# Patient Record
Sex: Male | Born: 1950 | Race: White | Hispanic: No | Marital: Single | Smoking: Former smoker
Health system: Southern US, Community
[De-identification: ages and names within clinical notes are randomized; demographics above are authoritative.]

## PROBLEM LIST (undated history)

## (undated) DIAGNOSIS — J45909 Unspecified asthma, uncomplicated: Secondary | ICD-10-CM

## (undated) DIAGNOSIS — I10 Essential (primary) hypertension: Secondary | ICD-10-CM

## (undated) DIAGNOSIS — F32A Depression, unspecified: Secondary | ICD-10-CM

## (undated) DIAGNOSIS — D649 Anemia, unspecified: Secondary | ICD-10-CM

## (undated) DIAGNOSIS — I739 Peripheral vascular disease, unspecified: Secondary | ICD-10-CM

## (undated) DIAGNOSIS — E119 Type 2 diabetes mellitus without complications: Secondary | ICD-10-CM

## (undated) DIAGNOSIS — F329 Major depressive disorder, single episode, unspecified: Secondary | ICD-10-CM

## (undated) DIAGNOSIS — E785 Hyperlipidemia, unspecified: Secondary | ICD-10-CM

## (undated) DIAGNOSIS — G8929 Other chronic pain: Secondary | ICD-10-CM

## (undated) DIAGNOSIS — G4730 Sleep apnea, unspecified: Secondary | ICD-10-CM

## (undated) DIAGNOSIS — K219 Gastro-esophageal reflux disease without esophagitis: Secondary | ICD-10-CM

## (undated) DIAGNOSIS — N189 Chronic kidney disease, unspecified: Secondary | ICD-10-CM

## (undated) HISTORY — DX: Type 2 diabetes mellitus without complications: E11.9

## (undated) HISTORY — DX: Hyperlipidemia, unspecified: E78.5

## (undated) HISTORY — DX: Anemia, unspecified: D64.9

## (undated) HISTORY — DX: Other chronic pain: G89.29

## (undated) HISTORY — DX: Essential (primary) hypertension: I10

## (undated) HISTORY — DX: Depression, unspecified: F32.A

---
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
## 2008-04-10 ENCOUNTER — Encounter: Admission: RE | Admit: 2008-04-10 | Discharge: 2008-04-10 | Payer: Self-pay | Admitting: *Deleted

## 2008-05-10 ENCOUNTER — Ambulatory Visit (HOSPITAL_COMMUNITY): Admission: RE | Admit: 2008-05-10 | Discharge: 2008-05-10 | Payer: Self-pay | Admitting: *Deleted

## 2008-05-17 ENCOUNTER — Ambulatory Visit (HOSPITAL_COMMUNITY): Admission: RE | Admit: 2008-05-17 | Discharge: 2008-05-17 | Payer: Self-pay | Admitting: *Deleted

## 2008-10-06 ENCOUNTER — Encounter: Admission: RE | Admit: 2008-10-06 | Discharge: 2009-01-04 | Payer: Self-pay | Admitting: *Deleted

## 2008-10-16 ENCOUNTER — Inpatient Hospital Stay (HOSPITAL_COMMUNITY): Admission: RE | Admit: 2008-10-16 | Discharge: 2008-10-19 | Payer: Self-pay | Admitting: *Deleted

## 2008-10-17 ENCOUNTER — Encounter (INDEPENDENT_AMBULATORY_CARE_PROVIDER_SITE_OTHER): Payer: Self-pay | Admitting: *Deleted

## 2008-10-17 ENCOUNTER — Ambulatory Visit: Payer: Self-pay | Admitting: Surgery

## 2008-10-26 ENCOUNTER — Emergency Department (HOSPITAL_COMMUNITY): Admission: EM | Admit: 2008-10-26 | Discharge: 2008-10-26 | Payer: Self-pay | Admitting: Emergency Medicine

## 2009-12-06 IMAGING — RF DG UGI W/ GASTROGRAFIN
8 series · 15 of 15 positions shown · non-contrast
Comparison: none

CLINICAL DATA: Morbid obesity.  Gastric bypass.

WATER SOLUBLE UPPER GI SERIES
TECHNIQUE: Single-column upper GI series was performed using 50 ml
Gastrografin.
Fluoroscopy Time: 3.9 minutes.

[Series 1: run · 3 of 3 slices shown (1 of 6)]
[im 1/3]
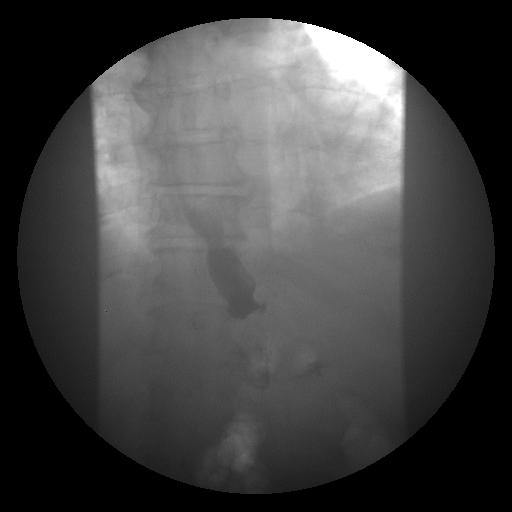
[im 2/3]
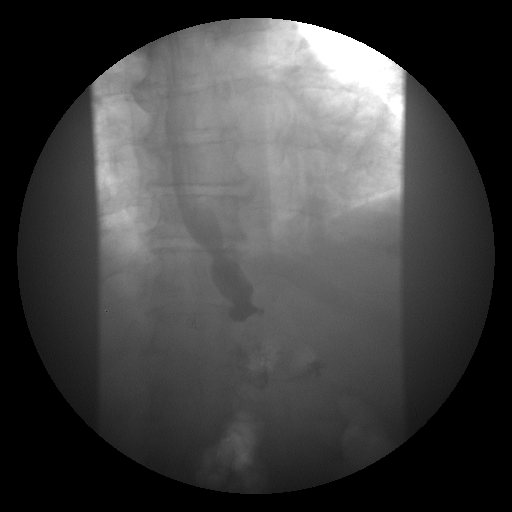
[im 3/3]
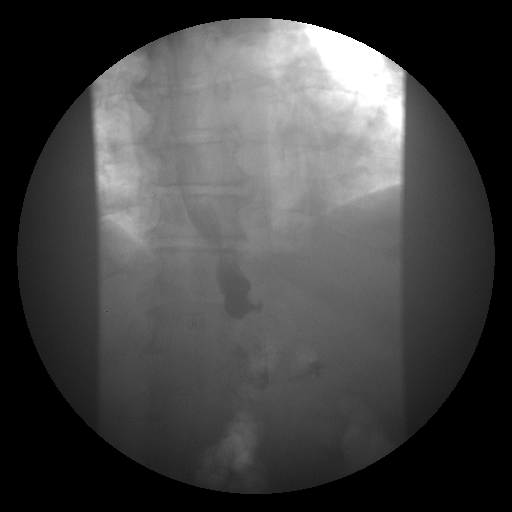

[Series 2: run · 2 of 2 slices shown (2 of 6)]
[im 1/2]
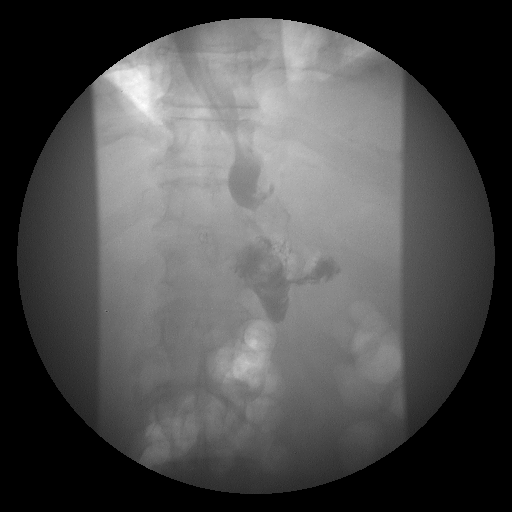
[im 2/2]
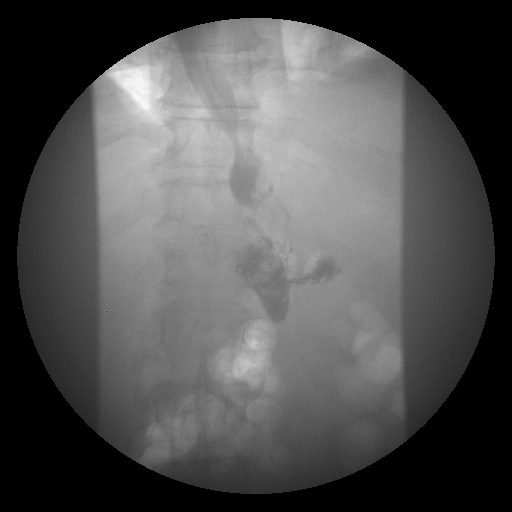

[Series 3: run · 2 of 2 slices shown (3 of 6)]
[im 1/2]
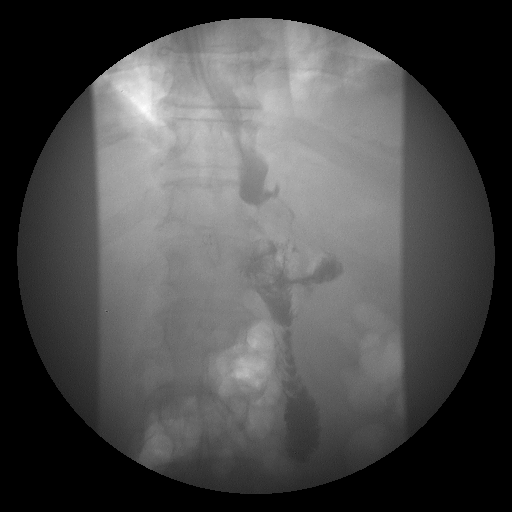
[im 2/2]
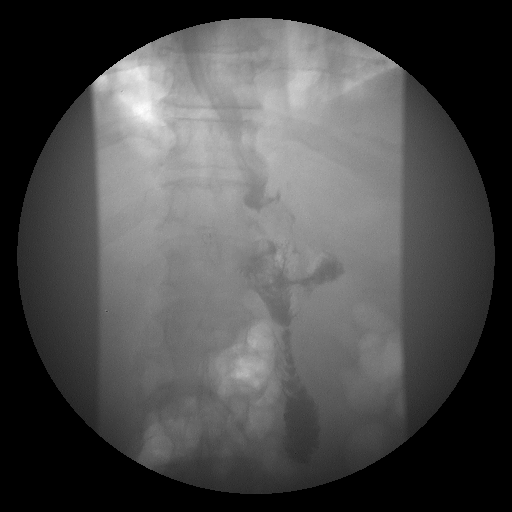

[Series 4: run · 1 of 1 slices shown (4 of 6)]
[im 1/1]
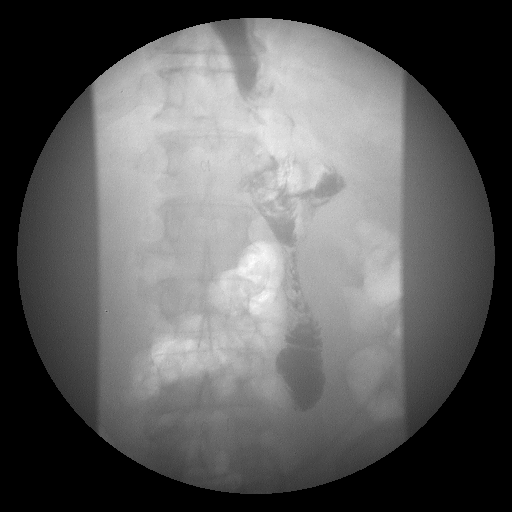

[Series 5: run · 3 of 3 slices shown (5 of 6)]
[im 1/3]
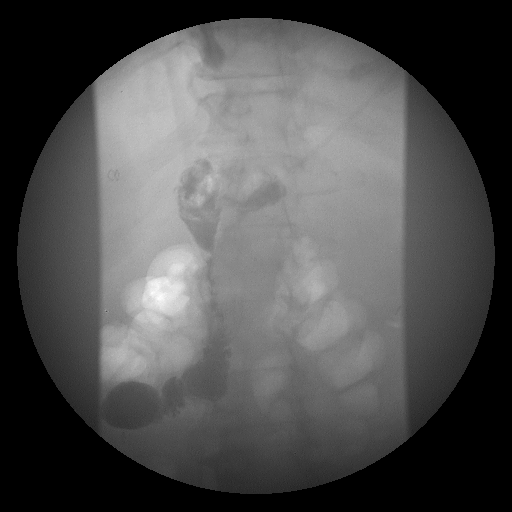
[im 2/3]
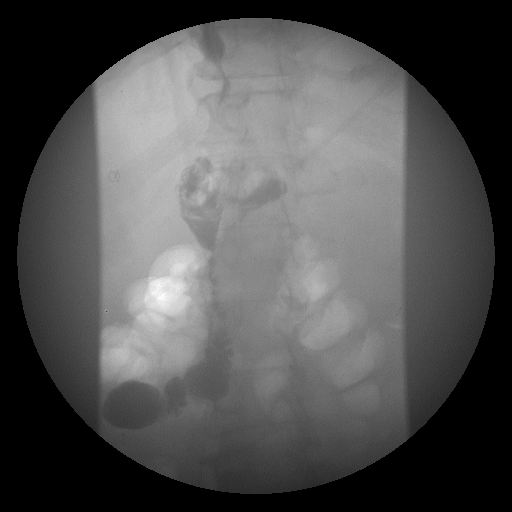
[im 3/3]
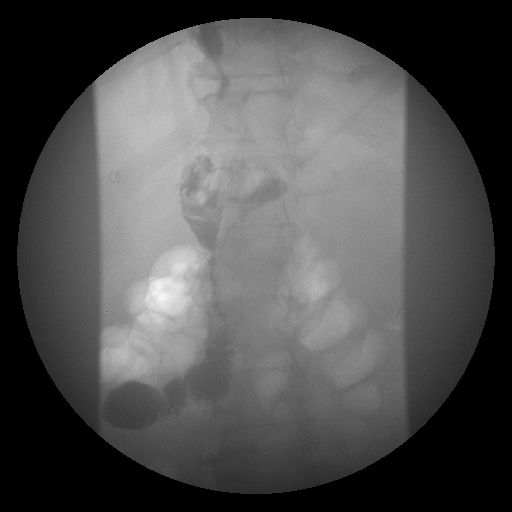

[Series 6: run · 2 of 2 slices shown (6 of 6)]
[im 1/2]
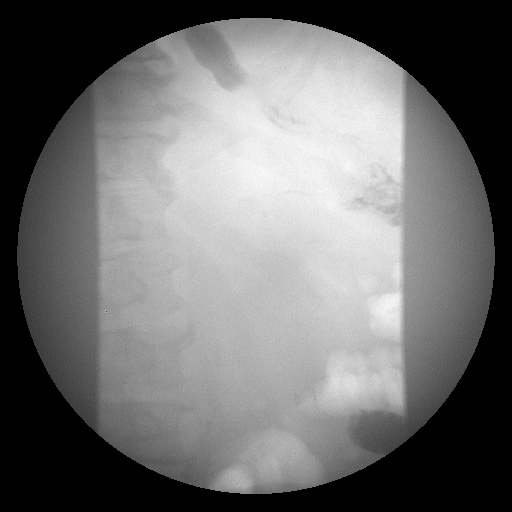
[im 2/2]
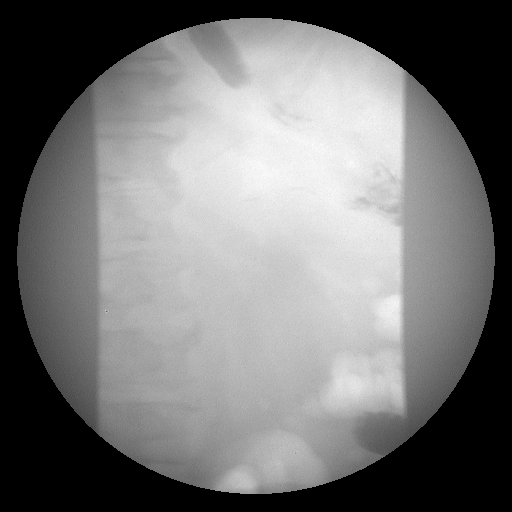

[Series 1001: view not recorded · 0.20mm/px · 1 of 1 slices shown (1 of 2)]
[im 1/1]
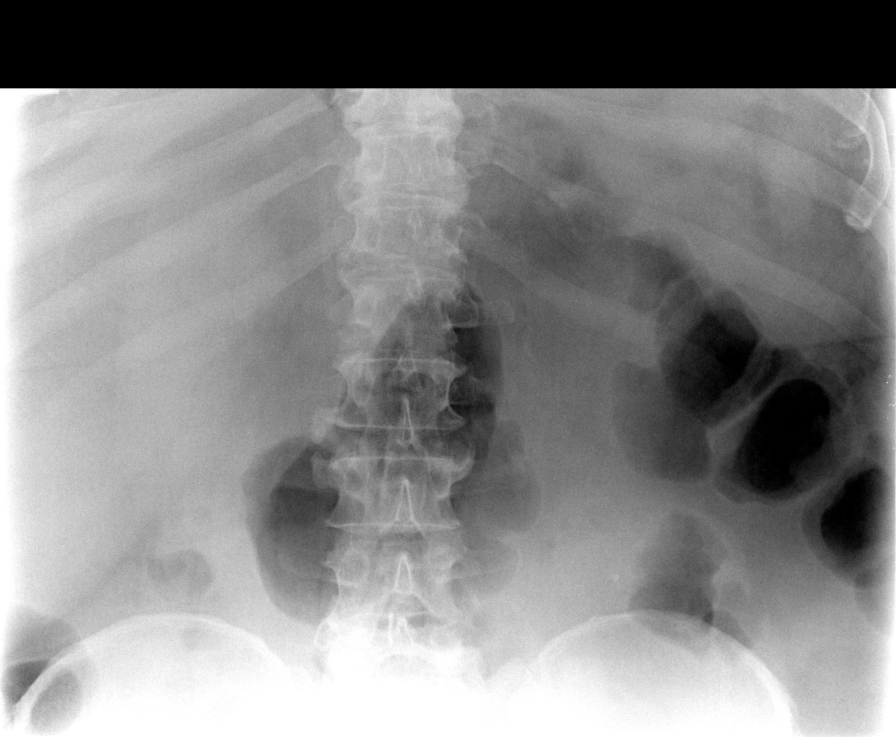

[Series 1002: view not recorded · 0.20mm/px · 1 of 1 slices shown (2 of 2)]
[im 1/1]
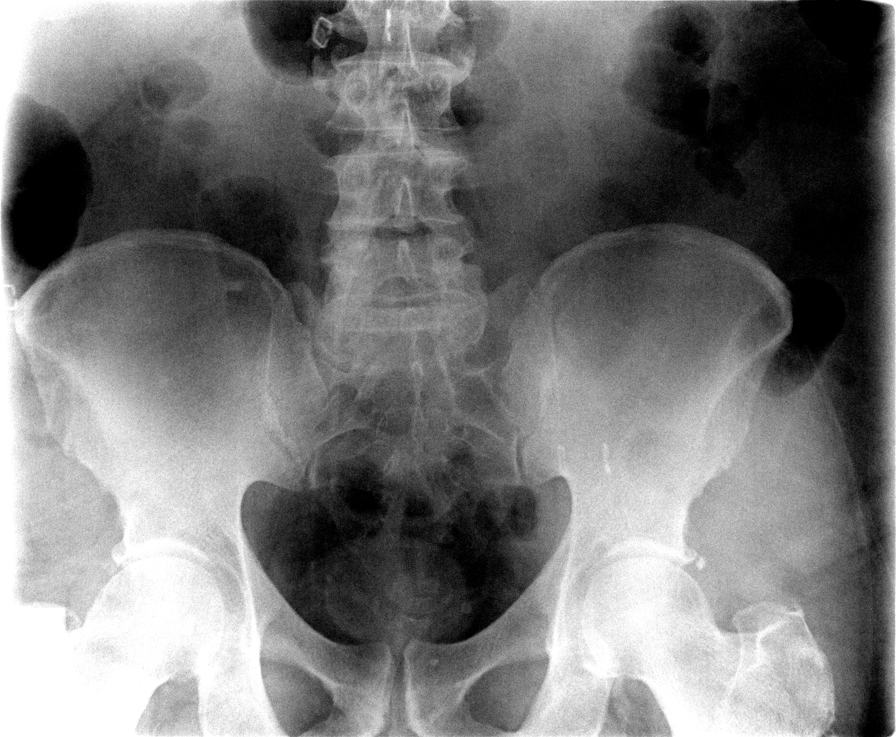

[15 of 15 positions shown; findings below may reference images not displayed]

FINDINGS: The patient has had an antecolic and antegastric bypass.
Roux-en-Y anatomy is intact.  No leakage or obstruction.
Unremarkable appearance of the gastrojejunal and jejunojejunal
anastomoses.
IMPRESSION: Satisfactory appearance of the postoperative Roux-en-Y anatomy

## 2011-03-18 NOTE — Discharge Summary (Signed)
[name], [name]                  ACCOUNT NO.:  [id number]   MEDICAL RECORD NO.:  [id number]          PATIENT TYPE:  INP   LOCATION:  1525                         FACILITY:  [facility]   PHYSICIAN:  Alfonse Ras, MD   DATE OF BIRTH:  1951-09-07   DATE OF ADMISSION:  10/16/2008  DATE OF DISCHARGE:  10/19/2008                               DISCHARGE SUMMARY   ADMISSION DIAGNOSES:  1. Medically refractory morbid obesity.  2. Insulin-dependent diabetes mellitus.  3. Hypertension from obstructive sleep apnea.   BRIEF HPI:  The patient is a very pleasant 60 year old male who has been  worked up extensively, preoperatively, for laparoscopic Roux-en-Y  gastric bypass.  He underwent home bowel prep and was admitted.   HOSPITAL COURSE:  The patient was admitted and had laparoscopic Roux-en-  Y gastric bypass ante-colic-antegastric with closure of Peterson's  defect.  Postoperatively, he did well, and underwent upper GI study  which showed no evidence of leak.  On postoperative day #1, he was  advanced to a liquid diet, and then by postoperative day  #2 was doing  very well.  Postoperative day #2, diet with protein shakes.  However,  his hemoglobin directed down to 8.9.  He was kept in the hospital that  night.  His heart rate was in the 90s.  His hemoglobin was checked  following morning, it was 8.4.  It was checked later that day on  postoperative day #3, and was up to 8.9, and he was ready for discharge  home.   DISPOSITION:  Discharge to home.  Followup is with me in 2 weeks.  Medications include all medications in the med rec form, and we would  like to start elixir for pain.      Alfonse Ras, MD  Electronically Signed     KRE/MEDQ  D:  11/16/2008  T:  11/17/2008  Job:  [id number]

## 2011-03-18 NOTE — H&P (Signed)
[name], [name]                  ACCOUNT NO.:  [id number]   MEDICAL RECORD NO.:  [id number]          PATIENT TYPE:  EMS   LOCATION:  MAJO                         FACILITY:  [facility]   PHYSICIAN:  Sandria Bales. Ezzard Standing, M.D.  DATE OF BIRTH:  1951/09/26   DATE OF ADMISSION:  10/26/2008  DATE OF DISCHARGE:  10/26/2008                              HISTORY & PHYSICAL   Date of surgery ??   CHIEF COMPLAINT:  Nausea.   HISTORY OF ILLNESS:  [name] is a 60 year old white male who is  morbidly obese and is a patient of Dr. Dineen Kid in [place].  He underwent  an uneventful laparoscopic Roux-en-Y gastric bypass by Dr. Baruch Merl  on October 16, 2008, and was discharged on October 19, 2008.  He  weighed initially as much as 390 pounds.  He has lost down to 353 pounds  in anticipation of his bypass surgery.   He is taking a post op RYGB diet which includes protein drinks and  liquids only.  He has not been advanced to regular foods though he says  he is hungry now.   He apparently had some nausea, called last night and a lab was obtained  where his white blood count was noted to be 17,000.  (I do not have this  lab in front of me.)  He spoke with Dr. Johna Sheriff and because of the  question of nausea and of a white blood count, it is thought that he be  best seen in the emergency room.   So, Dr. Johna Sheriff sent him to [facility] Emergency Room where I could evaluate  him.   He has had some trouble eating though he has some Benadryl that he took  for some sinus drainage yesterday and this has helped his nausea some  also.  He has had some loose stools since his surgery, but actually he  seems to feel pretty good.   He is most concerned about a low abdominal wall ecchymotic area which I  think is just from the surgery, but there is no evidence of any  infection, cellulitis, or acute changes.   PAST MEDICAL HISTORY:  He has an allergy to MORPHINE.   MEDICATIONS:  He takes the pain medicines.  He is  on,  1. Diovan for his blood pressure.  2. Actos for diabetes.  3. Xanax.  4. He was on Glucotrol and insulin, but he stopped both because his      blood sugars got so low.   Also, he suffers from hypertension.   He has insulin-dependent diabetes mellitus which has improved since his  surgery.   Then, he has depression and anxiety for which he takes the Xanax.   He also has sleep apnea.  He was supposed to be use the CPAP machine,  but he says he cannot tolerate to be on his nose or face.   PHYSICAL EXAMINATION:  VITAL SIGNS:  His temperature was 97, blood  pressure 192/52, pulse is 91, and respirations 24.  GENERAL:  He is a well-nourished obese white male, alert, and  cooperative with  the physical exam.  HEENT:  Unremarkable.  CARDIAC:  His lungs are clear to auscultation.  HEART:  Regular rate and rhythm without murmur or rub.  ABDOMEN:  He has active bowel sounds.  He has a lot of ecchymoses in his  lower abdominal wall.  It looks as it emanates from the left paramedian  trocar, but again his ecchymoses appears all.  There is no acute  inflammation or cellulitis.  I think this will keep getting better with  time.  EXTREMITIES:  He has good strength in all 4 extremities.   LABORATORY DATA:  White blood count 12,000 with 74% neutrophils.  He has  hemoglobin of  9.4, hematocrit 28.6, and platelet count 425,000.  His  sodium 133, potassium 3.3, chloride of 97, BUN of 18, creatinine of  0.85, and his lipase was 20.   IMPRESSION:  1. Status post right gastric bypass.  I think he is doing well except      I think for his abnormal hematoma, which I think is going to      resolve without any further intervention.  I tried to reassure him      that he could use the Benadryl for nausea or just stay on his      liquids like he doing right now.  He is set to see Dr. Colin Benton on      November 02, 2008.  He should keep that appointment.  2. Postoperative/blood loss anemia which is  stable.  3. Nausea which is mild and I encouraged him that the nausea will get      better on its own.  4. Hypertension for which he is going to keep an eye on his blood      pressure medicine.  5. Diabetes mellitus.  He has already stopped his insulin.  6. Xanax for depression.      Sandria Bales. Ezzard Standing, M.D.  Electronically Signed     DHN/MEDQ  D:  10/26/2008  T:  10/27/2008  Job:  [id number]   cc:   Alfonse Ras, MD  Dr. Dineen Kid

## 2011-03-18 NOTE — Op Note (Signed)
NAMEABOUBACAR, MATSUO                  ACCOUNT NO.:  [id number]   MEDICAL RECORD NO.:  [id number]          PATIENT TYPE:  INP   LOCATION:  1525                         FACILITY:  [facility]   PHYSICIAN:  Sharlet Salina T. Hoxworth, M.D.DATE OF BIRTH:  1950-12-19   DATE OF PROCEDURE:  10/16/2008  DATE OF DISCHARGE:                               OPERATIVE REPORT   PROCEDURE:  Upper GI endoscopy.   DESCRIPTION OF PROCEDURE:  Upper endoscopy is performed at the  completion of laparoscopic Roux-en-Y gastric bypass by Dr. Colin Benton.  The  Olympus video endoscope was inserted in the upper esophagus and passed  under direct vision to the EG junction at approximately 45 cm.  Esophagus appeared normal.  The small gastric pouch was entered.  The  pouch was then tensely distended with air while Dr. Colin Benton clamped the  outlet and there was no evidence of leak.  Suture and staple lines  appeared intact.  The anastomosis was visualized and was patent.  The  pouch measured 5 cm in length.  The pouch was then desufflated.  The  scope withdrawn.      Lorne Skeens. Hoxworth, M.D.  Electronically Signed     BTH/MEDQ  D:  10/16/2008  T:  10/17/2008  Job:  [id number]

## 2011-03-18 NOTE — Op Note (Signed)
NAMEWALI, REINHEIMER                  ACCOUNT NO.:  [id number]   MEDICAL RECORD NO.:  [id number]          PATIENT TYPE:  INP   LOCATION:  1525                         [facility]ACILITY:  [facility]   PHYSICIAN:  Alfonse Ras, MD   DATE OF BIRTH:  [birth year]   DATE OF PROCEDURE:  DATE OF DISCHARGE:                               OPERATIVE REPORT   PREOPERATIVE DIAGNOSIS:  Medically refractory morbid obesity with a BMI  of 54.   POSTOPERATIVE DIAGNOSIS:  Medically refractory morbid obesity with a BMI  of 54.   PROCEDURE:  Laparoscopic Roux-en-Y gastric bypass, antecolic antegastric  with closure of Peterson defect and upper endoscopy.   SURGEON:  Alfonse Ras, MD   ASSISTANT:  Lorne Skeens. Hoxworth, MD   ANESTHESIA:  General.   DESCRIPTION:  The patient was taken to the operating room and placed in  supine position.  After adequate general anesthesia was induced using  endotracheal tube, Foley catheter was placed and the abdomen was prepped  and draped in normal sterile fashion.  Using a 12-mm OptiVu in the left  upper quadrant, under direct vision peritoneal access was obtained.  Pneumoperitoneum was obtained.  The patient had a large sea of fat with  the omentum.  The two 12-mm trocars were placed under direct vision in  the right mid-abdomen.  Additional 12-mm trocar was placed in the left  periumbilical region.  Additional 5-mm trocar was placed in the left  abdomen.  The omentum was retracted superiorly and the ligament of  Treitz was identified.  This was transected using a 60-mm white load GIA  stapling device 40 cm distal to the ligament.  The Roux limb side was  tagged with a Penrose drain counting a 100 cm down.  Side-to-side  jejunojejunostomy was performed in the standard fashion with a 45-mm  white load GIA.  The defect was closed with running 2-0 Vicryl suture  and inspected.  Mesenteric defect was closed with a running 2-0 silk  suture.  The staple line was then reinforced  with Tisseel.   I then turned my attention to the epigastrium.  The patient was placed  in steep head-up position.  Nathanson liver retractor was introduced and  the left lateral segment liver was retracted anteriorly.  The angle of  Hiss was sharply and bluntly dissected.  An area of approximately 4 cm  to 5 cm distal to the EG junction was identified on the lesser curve.  The lesser sac was entered with sharp and blunt dissection.  Serial  firings of first a gold load 60-mm GIA stapling device and 3 additional  blue load stapling devices were utilized to create the pouch.  This was  accomplished with an Ewald tube for checking patency of the EG junction.  The remnant was oversewn with a running locking 2-0 silk suture.  Tisseel was placed in the very proximal end of the pouch staple line.  The Roux limb was then brought outside the pouch and a posterior running  2-0 Vicryl suture was placed.  Gastrotomy and enterotomy was then  created  with a harmonic scalpel.  The side-to-side gastrojejunostomy was  accomplished with a 45-mm blue load stapling device.  The defect was  closed with running 2-0 Vicryl suture.  The Ewald tube was then placed  down to the anastomosis and the anterior layer was accomplished with a  running 2-0 Vicryl suture.  Vonita Moss defect was then closed with a  figure-of-eight silk suture.  At this point, Dr. Johna Sheriff performed  upper endoscopy after I clamped the Roux limb and irrigated the upper  abdomen.  There were no bubbles and showed patency of the anastomosis.  The fluid was removed.  The endoscope was removed.  The staple line was  reinforced with Tisseel.  Nathanson liver  retractor was removed.  Pneumoperitoneum was released.  Trocars were  removed, and the skin was closed with staples.  Sterile dressings were  applied.  The patient tolerated the procedure well and went to PACU in  good condition.      Alfonse Ras, MD  Electronically Signed      KRE/MEDQ  D:  10/16/2008  T:  10/16/2008  Job:  [id number]

## 2011-08-08 LAB — COMPREHENSIVE METABOLIC PANEL
ALT: 46 U/L (ref 0–53)
AST: 22 U/L (ref 0–37)
Albumin: 3.6 g/dL (ref 3.5–5.2)
Alkaline Phosphatase: 56 U/L (ref 39–117)
Alkaline Phosphatase: 68 U/L (ref 39–117)
BUN: 15 mg/dL (ref 6–23)
CO2: 28 mEq/L (ref 19–32)
Chloride: 97 mEq/L (ref 96–112)
Creatinine, Ser: 0.84 mg/dL (ref 0.4–1.5)
Creatinine, Ser: 0.85 mg/dL (ref 0.4–1.5)
GFR calc Af Amer: >60 mL/min (ref >60)
Glucose, Bld: 148 mg/dL — ABNORMAL HIGH (ref 70–99)
Potassium: 3.3 mEq/L — ABNORMAL LOW (ref 3.5–5.1)
Potassium: 3.6 mEq/L (ref 3.5–5.1)
Sodium: 133 mEq/L — ABNORMAL LOW (ref 135–145)
Total Bilirubin: 0.6 mg/dL (ref 0.3–1.2)
Total Bilirubin: 1.2 mg/dL (ref 0.3–1.2)
Total Protein: 6.8 g/dL (ref 6.0–8.3)

## 2011-08-08 LAB — DIFFERENTIAL
Basophils Absolute: 0.1 10*3/uL (ref 0.0–0.1)
Basophils Absolute: 0.1 10*3/uL (ref 0.0–0.1)
Basophils Absolute: 0.4 10*3/uL — ABNORMAL HIGH (ref 0.0–0.1)
Basophils Relative: 2 % — ABNORMAL HIGH (ref 0–1)
Eosinophils Absolute: 0.2 10*3/uL (ref 0.0–0.7)
Eosinophils Relative: 0 % (ref 0–5)
Eosinophils Relative: 0 % (ref 0–5)
Eosinophils Relative: 2 % (ref 0–5)
Lymphocytes Relative: 13 % (ref 12–46)
Lymphocytes Relative: 18 % (ref 12–46)
Lymphocytes Relative: 6 % — ABNORMAL LOW (ref 12–46)
Lymphs Abs: 2.5 10*3/uL (ref 0.7–4.0)
Monocytes Absolute: 0.7 10*3/uL (ref 0.1–1.0)
Monocytes Absolute: 0.7 10*3/uL (ref 0.1–1.0)
Monocytes Relative: 4 % (ref 3–12)
Neutro Abs: 8.2 10*3/uL — ABNORMAL HIGH (ref 1.7–7.7)
Neutrophils Relative %: 73 % (ref 43–77)

## 2011-08-08 LAB — GLUCOSE, CAPILLARY
Glucose-Capillary: 126 mg/dL — ABNORMAL HIGH (ref 70–99)
Glucose-Capillary: 136 mg/dL — ABNORMAL HIGH (ref 70–99)
Glucose-Capillary: 139 mg/dL — ABNORMAL HIGH (ref 70–99)
Glucose-Capillary: 139 mg/dL — ABNORMAL HIGH (ref 70–99)
Glucose-Capillary: 145 mg/dL — ABNORMAL HIGH (ref 70–99)
Glucose-Capillary: 146 mg/dL — ABNORMAL HIGH (ref 70–99)
Glucose-Capillary: 204 mg/dL — ABNORMAL HIGH (ref 70–99)
Glucose-Capillary: 216 mg/dL — ABNORMAL HIGH (ref 70–99)
Glucose-Capillary: 241 mg/dL — ABNORMAL HIGH (ref 70–99)

## 2011-08-08 LAB — CBC
HCT: 26.1 % — ABNORMAL LOW (ref 39.0–52.0)
HCT: 30.7 % — ABNORMAL LOW (ref 39.0–52.0)
Hemoglobin: 10.4 g/dL — ABNORMAL LOW (ref 13.0–17.0)
Hemoglobin: 13.1 g/dL (ref 13.0–17.0)
MCHC: 33.9 g/dL (ref 30.0–36.0)
MCV: 86.4 fL (ref 78.0–100.0)
MCV: 86.8 fL (ref 78.0–100.0)
Platelets: 276 10*3/uL (ref 150–400)
Platelets: 425 10*3/uL — ABNORMAL HIGH (ref 150–400)
RBC: 3.25 MIL/uL — ABNORMAL LOW (ref 4.22–5.81)
RBC: 4.55 MIL/uL (ref 4.22–5.81)
RDW: 15.1 % (ref 11.5–15.5)
RDW: 15.2 % (ref 11.5–15.5)
WBC: 11.2 10*3/uL — ABNORMAL HIGH (ref 4.0–10.5)
WBC: 12 10*3/uL — ABNORMAL HIGH (ref 4.0–10.5)

## 2011-08-08 LAB — HEMOGLOBIN AND HEMATOCRIT, BLOOD
HCT: 24.7 % — ABNORMAL LOW (ref 39.0–52.0)
HCT: 34.5 % — ABNORMAL LOW (ref 39.0–52.0)
Hemoglobin: 11.5 g/dL — ABNORMAL LOW (ref 13.0–17.0)
Hemoglobin: 8.4 g/dL — ABNORMAL LOW (ref 13.0–17.0)
Hemoglobin: 9.3 g/dL — ABNORMAL LOW (ref 13.0–17.0)

## 2012-07-26 ENCOUNTER — Telehealth (INDEPENDENT_AMBULATORY_CARE_PROVIDER_SITE_OTHER): Payer: Self-pay | Admitting: Surgery

## 2012-07-26 NOTE — Telephone Encounter (Signed)
I tried to call the patient to schedule a bariatric surgery follow-up appointment. The phone # listed has been disconnected [phone].Matthew KitchenMarland Duarte

## 2016-08-08 ENCOUNTER — Encounter (HOSPITAL_COMMUNITY): Payer: Self-pay

## 2017-05-22 ENCOUNTER — Encounter (HOSPITAL_COMMUNITY): Payer: Self-pay

## 2018-01-21 ENCOUNTER — Ambulatory Visit (INDEPENDENT_AMBULATORY_CARE_PROVIDER_SITE_OTHER): Payer: Medicare HMO | Admitting: Cardiovascular Disease

## 2018-01-21 ENCOUNTER — Encounter: Payer: Self-pay | Admitting: Cardiovascular Disease

## 2018-01-21 VITALS — BP 128/66 | HR 82 | Ht 67.0 in | Wt 321.0 lb

## 2018-01-21 DIAGNOSIS — E785 Hyperlipidemia, unspecified: Secondary | ICD-10-CM | POA: Insufficient documentation

## 2018-01-21 DIAGNOSIS — I350 Nonrheumatic aortic (valve) stenosis: Secondary | ICD-10-CM | POA: Diagnosis not present

## 2018-01-21 DIAGNOSIS — I10 Essential (primary) hypertension: Secondary | ICD-10-CM

## 2018-01-21 DIAGNOSIS — E7800 Pure hypercholesterolemia, unspecified: Secondary | ICD-10-CM | POA: Diagnosis not present

## 2018-01-21 NOTE — Assessment & Plan Note (Signed)
History of hyperlipidemia on statin therapy followed by his PCP 

## 2018-01-21 NOTE — Assessment & Plan Note (Signed)
History of essential hypertension with blood pressure measured at 128/66. He is on lisinopril andbhydrochlorothiazide. Continue current meds current dosing.

## 2018-01-21 NOTE — H&P (View-Only) (Signed)
01/21/2018 [name]   [birth year]  [id number]  Primary Physician Patient, No Pcp Per Primary Cardiologist: Runell Gess MD [name], [place]  HPI:  [name] is a 67 y.o. morbidly overweight divorced Caucasian male father of 4 sons who is accompanied by his niece today. He was referred by Dr. Hanley Hays for  left heart cardiac catheterization because of moderate aortic stenosis and symptoms of chest pain and shortness of breath. He has a history remote tobacco abuse, treated diabetes, hypertension and hyperlipidemia. Never had a heart attack or stroke. He is disabled because of his "legs". 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 Over the last 2 months she's had increasing exertional chest pressure and dyspnea on exertion. Recent 2-D echo performed 01/13/66 year old normal LV function, mild concentric LVH with moderate aortic stenosis. His valve area was 1.127 m with a peak gradient of 44 mmHg.   Current Meds  Medication Sig  . aspirin EC 325 MG tablet Take 325 mg by mouth daily.  Marland Kitchen buPROPion (WELLBUTRIN) 100 MG tablet Take 100 mg by mouth.  . gabapentin (NEURONTIN) 300 MG capsule Take 600 mg by mouth at bedtime.  Marland Kitchen lisinopril-hydrochlorothiazide (PRINZIDE,ZESTORETIC) 20-12.5 MG tablet TAKE 1 TABLET EVERY DAY  . omeprazole (PRILOSEC) 40 MG capsule Take 40 mg by mouth 2 (two) times daily.  Marland Kitchen oxyCODONE (ROXICODONE) 15 MG immediate release tablet Take 15 mg by mouth 4 (four) times daily.  . pioglitazone (ACTOS) 30 MG tablet Take 30 mg by mouth daily.  . pravastatin (PRAVACHOL) 20 MG tablet Take 20 mg by mouth at bedtime.  Marland Kitchen rOPINIRole (REQUIP) 2 MG tablet TAKE 1 TABLET TWICE DAILY     Allergies  Allergen Reactions  . Atorvastatin Other (See Comments)  . Bee Venom Swelling    Social History   Socioeconomic History  . Marital status: Single    Spouse name: Not on file  . Number of children: Not on file  . Years of education: Not on file  . Highest education level:  Not on file  Occupational History  . Not on file  Social Needs  . Financial resource strain: Not on file  . Food insecurity:    Worry: Not on file    Inability: Not on file  . Transportation needs:    Medical: Not on file    Non-medical: Not on file  Tobacco Use  . Smoking status: Former Games developer  . Smokeless tobacco: Never Used  Substance and Sexual Activity  . Alcohol use: No    Frequency: Never  . Drug use: No  . Sexual activity: Not on file  Lifestyle  . Physical activity:    Days per week: Not on file    Minutes per session: Not on file  . Stress: Not on file  Relationships  . Social connections:    Talks on phone: Not on file    Gets together: Not on file    Attends religious service: Not on file    Active member of club or organization: Not on file    Attends meetings of clubs or organizations: Not on file    Relationship status: Not on file  . Intimate partner violence:    Fear of current or ex partner: Not on file    Emotionally abused: Not on file    Physically abused: Not on file    Forced sexual activity: Not on file  Other Topics Concern  . Not on file  Social History Narrative  .  Not on file     Review of Systems: General: negative for chills, fever, night sweats or weight changes.  Cardiovascular: negative for chest pain, dyspnea on exertion, edema, orthopnea, palpitations, paroxysmal nocturnal dyspnea or shortness of breath Dermatological: negative for rash Respiratory: negative for cough or wheezing Urologic: negative for hematuria Abdominal: negative for nausea, vomiting, diarrhea, bright red blood per rectum, melena, or hematemesis Neurologic: negative for visual changes, syncope, or dizziness All other systems reviewed and are otherwise negative except as noted above.    Blood pressure 128/66, pulse 82, height 5\' 7"  (1.702 m), weight (!) 321 lb (145.6 kg).  General appearance: alert and no distress Neck: no adenopathy, no JVD, supple,  symmetrical, trachea midline, thyroid not enlarged, symmetric, no tenderness/mass/nodules and bilateral carotid bruits versus transmitted murmur Lungs: clear to auscultation bilaterally Heart: 2/6 systolic ejection murmur at the base consistent with aortic stenosis. Extremities: 1-2+ pitting edema bilaterally Pulses: 2+ and symmetric Skin: Skin color, texture, turgor normal. No rashes or lesions Neurologic: Alert and oriented X 3, normal strength and tone. Normal symmetric reflexes. Normal coordination and gait  EKG sinus rhythm at 82 with right bundle-branch block. I personally reviewed this EKG.  ASSESSMENT AND PLAN:   Moderate aortic stenosis [name] was referred by Dr. Hanley Haysosario for right left heart cardiac catheterization because of recently diagnosed moderate aortic stenosis by 2-D echo performed 01/12/18. His ejection fraction was normal with mild concentric LVH, his aortic valve area is 1.17 m with a peak gradient of 44 mmHg. He does complain of his exertional chest pressure and dyspnea worsening worsening over the last 2 months.The patient understands that risks included but are not limited to stroke (1 in 1000), death (1 in 1000), kidney failure [usually temporary] (1 in 500), bleeding (1 in 200), allergic reaction [possibly serious] (1 in 200). The patient understands and agrees to proceed  Essential hypertension History of essential hypertension with blood pressure measured at 128/66. He is on lisinopril andbhydrochlorothiazide. Continue current meds current dosing.  Hyperlipidemia History of hyperlipidemia on statin therapy followed by his PCP      Runell GessJonathan J. Bellarose Burtt MD [facility] LLCFACP,FACC,FAHA, [facility] SouthFSCAI 01/21/2018 2:13 PM

## 2018-01-21 NOTE — Assessment & Plan Note (Signed)
[name] was referred by Dr. Hanley Haysosario for right left heart cardiac catheterization because of recently diagnosed moderate aortic stenosis by 2-D echo performed 01/12/18. His ejection fraction was normal with mild concentric LVH, his aortic valve area is 1.17 m with a peak gradient of 44 mmHg. He does complain of his exertional chest pressure and dyspnea worsening worsening over the last 2 months.The patient understands that risks included but are not limited to stroke (1 in 1000), death (1 in 1000), kidney failure [usually temporary] (1 in 500), bleeding (1 in 200), allergic reaction [possibly serious] (1 in 200). The patient understands and agrees to proceed

## 2018-01-21 NOTE — Patient Instructions (Signed)
   [name] [facility] [address] [place] [address] Dept: [phone] Loc: [phone]  Matthew AntuJoe Duarte  01/21/2018  You are scheduled for a Cardiac Catheterization on Thursday, March 28 with Dr. Nanetta BattyJonathan Duarte.  1. [facility] at [facility]: [address] [place], [place] [address] at 11:30 AM (two hours before your procedure to ensure your preparation). Free valet parking service is available.   Special note: Every effort is made to have your procedure done on time. Please understand that emergencies sometimes delay scheduled procedures.  2. Diet: Do not eat or drink anything after midnight prior to your procedure except sips of water to take medications.  3. Labs: Please have labs drawn in our office today.  4. Medication instructions in preparation for your procedure:  HOLD diabetic medications on the morning of your procedure.  On the morning of your procedure, take your Aspirin and any morning medicines NOT listed above.  You may use sips of water.  5. Plan for one night stay--bring personal belongings. 6. Bring a current list of your medications and current insurance cards. 7. You MUST have a responsible person to drive you home. 8. Someone MUST be with you the first 24 hours after you arrive home or your discharge will be delayed. 9. Please wear clothes that are easy to get on and off and wear slip-on shoes.  Thank you for allowing us to care for you!   -- [name]  Post-procedure Follow-Up: Your physician recommends that you schedule a follow-up appointment 1-2 weeks after cath with Dr. Allyson SabalBerry.

## 2018-01-21 NOTE — Progress Notes (Signed)
01/21/2018 [name]   [birth year]  [id number]  Primary Physician Patient, No Pcp Per Primary Cardiologist: Runell Gess MD Nicholes Calamity, [place]  HPI:  [name] is a 67 y.o. morbidly overweight divorced Caucasian male father of 4 sons who is accompanied by his niece today. He was referred by Dr. Hanley Hays for  left heart cardiac catheterization because of moderate aortic stenosis and symptoms of chest pain and shortness of breath. He has a history remote tobacco abuse, treated diabetes, hypertension and hyperlipidemia. Never had a heart attack or stroke. He is disabled because of his "legs". 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 Over the last 2 months she's had increasing exertional chest pressure and dyspnea on exertion. Recent 2-D echo performed 01/13/66 year old normal LV function, mild concentric LVH with moderate aortic stenosis. His valve area was 1.127 m with a peak gradient of 44 mmHg.   Current Meds  Medication Sig  . aspirin EC 325 MG tablet Take 325 mg by mouth daily.  Marland Kitchen buPROPion (WELLBUTRIN) 100 MG tablet Take 100 mg by mouth.  . gabapentin (NEURONTIN) 300 MG capsule Take 600 mg by mouth at bedtime.  Marland Kitchen lisinopril-hydrochlorothiazide (PRINZIDE,ZESTORETIC) 20-12.5 MG tablet TAKE 1 TABLET EVERY DAY  . omeprazole (PRILOSEC) 40 MG capsule Take 40 mg by mouth 2 (two) times daily.  Marland Kitchen oxyCODONE (ROXICODONE) 15 MG immediate release tablet Take 15 mg by mouth 4 (four) times daily.  . pioglitazone (ACTOS) 30 MG tablet Take 30 mg by mouth daily.  . pravastatin (PRAVACHOL) 20 MG tablet Take 20 mg by mouth at bedtime.  Marland Kitchen rOPINIRole (REQUIP) 2 MG tablet TAKE 1 TABLET TWICE DAILY     Allergies  Allergen Reactions  . Atorvastatin Other (See Comments)  . Bee Venom Swelling    Social History   Socioeconomic History  . Marital status: Single    Spouse name: Not on file  . Number of children: Not on file  . Years of education: Not on file  . Highest education level:  Not on file  Occupational History  . Not on file  Social Needs  . Financial resource strain: Not on file  . Food insecurity:    Worry: Not on file    Inability: Not on file  . Transportation needs:    Medical: Not on file    Non-medical: Not on file  Tobacco Use  . Smoking status: Former Games developer  . Smokeless tobacco: Never Used  Substance and Sexual Activity  . Alcohol use: No    Frequency: Never  . Drug use: No  . Sexual activity: Not on file  Lifestyle  . Physical activity:    Days per week: Not on file    Minutes per session: Not on file  . Stress: Not on file  Relationships  . Social connections:    Talks on phone: Not on file    Gets together: Not on file    Attends religious service: Not on file    Active member of club or organization: Not on file    Attends meetings of clubs or organizations: Not on file    Relationship status: Not on file  . Intimate partner violence:    Fear of current or ex partner: Not on file    Emotionally abused: Not on file    Physically abused: Not on file    Forced sexual activity: Not on file  Other Topics Concern  . Not on file  Social History Narrative  .  Not on file     Review of Systems: General: negative for chills, fever, night sweats or weight changes.  Cardiovascular: negative for chest pain, dyspnea on exertion, edema, orthopnea, palpitations, paroxysmal nocturnal dyspnea or shortness of breath Dermatological: negative for rash Respiratory: negative for cough or wheezing Urologic: negative for hematuria Abdominal: negative for nausea, vomiting, diarrhea, bright red blood per rectum, melena, or hematemesis Neurologic: negative for visual changes, syncope, or dizziness All other systems reviewed and are otherwise negative except as noted above.    Blood pressure 128/66, pulse 82, height 5\' 7"  (1.702 m), weight (!) 321 lb (145.6 kg).  General appearance: alert and no distress Neck: no adenopathy, no JVD, supple,  symmetrical, trachea midline, thyroid not enlarged, symmetric, no tenderness/mass/nodules and bilateral carotid bruits versus transmitted murmur Lungs: clear to auscultation bilaterally Heart: 2/6 systolic ejection murmur at the base consistent with aortic stenosis. Extremities: 1-2+ pitting edema bilaterally Pulses: 2+ and symmetric Skin: Skin color, texture, turgor normal. No rashes or lesions Neurologic: Alert and oriented X 3, normal strength and tone. Normal symmetric reflexes. Normal coordination and gait  EKG sinus rhythm at 82 with right bundle-branch block. I personally reviewed this EKG.  ASSESSMENT AND PLAN:   Moderate aortic stenosis [name] was referred by Dr. Hanley Haysosario for right left heart cardiac catheterization because of recently diagnosed moderate aortic stenosis by 2-D echo performed 01/12/18. His ejection fraction was normal with mild concentric LVH, his aortic valve area is 1.17 m with a peak gradient of 44 mmHg. He does complain of his exertional chest pressure and dyspnea worsening worsening over the last 2 months.The patient understands that risks included but are not limited to stroke (1 in 1000), death (1 in 1000), kidney failure [usually temporary] (1 in 500), bleeding (1 in 200), allergic reaction [possibly serious] (1 in 200). The patient understands and agrees to proceed  Essential hypertension History of essential hypertension with blood pressure measured at 128/66. He is on lisinopril andbhydrochlorothiazide. Continue current meds current dosing.  Hyperlipidemia History of hyperlipidemia on statin therapy followed by his PCP      Runell GessJonathan J. Desha Bitner MD [facility] LL[facility]ACP,FACC,FAHA, [facility]SC[facility] 01/21/2018 2:13 PM

## 2018-01-22 LAB — BASIC METABOLIC PANEL
BUN / CREAT RATIO: 15 (ref 10–24)
BUN: 22 mg/dL (ref 8–27)
CHLORIDE: 100 mmol/L (ref 96–106)
CO2: 20 mmol/L (ref 20–29)
CREATININE: 1.47 mg/dL — AB (ref 0.76–1.27)
Calcium: 9 mg/dL (ref 8.6–10.2)
GFR calc Af Amer: 57 mL/min/{1.73_m2} — ABNORMAL LOW (ref >59)
GFR calc non Af Amer: 50 mL/min/{1.73_m2} — ABNORMAL LOW (ref >59)
Glucose: 142 mg/dL — ABNORMAL HIGH (ref 65–99)
POTASSIUM: 4.6 mmol/L (ref 3.5–5.2)
SODIUM: 138 mmol/L (ref 134–144)

## 2018-01-22 LAB — CBC WITH DIFFERENTIAL/PLATELET
Basophils Absolute: 0 10*3/uL (ref 0.0–0.2)
Basos: 0 %
EOS (ABSOLUTE): 0.2 10*3/uL (ref 0.0–0.4)
EOS: 2 %
HEMATOCRIT: 29.1 % — AB (ref 37.5–51.0)
Hemoglobin: 8.9 g/dL — ABNORMAL LOW (ref 13.0–17.7)
Immature Grans (Abs): 0 10*3/uL (ref 0.0–0.1)
Immature Granulocytes: 0 %
LYMPHS ABS: 2.8 10*3/uL (ref 0.7–3.1)
Lymphs: 28 %
MCH: 22.6 pg — ABNORMAL LOW (ref 26.6–33.0)
MCHC: 30.6 g/dL — AB (ref 31.5–35.7)
MCV: 74 fL — AB (ref 79–97)
MONOS ABS: 0.6 10*3/uL (ref 0.1–0.9)
Monocytes: 6 %
Neutrophils Absolute: 6.4 10*3/uL (ref 1.4–7.0)
Neutrophils: 64 %
PLATELETS: 352 10*3/uL (ref 150–379)
RBC: 3.93 x10E6/uL — ABNORMAL LOW (ref 4.14–5.80)
RDW: 17.4 % — AB (ref 12.3–15.4)
WBC: 10.1 10*3/uL (ref 3.4–10.8)

## 2018-01-22 LAB — APTT: APTT: 28 s (ref 24–33)

## 2018-01-22 LAB — TSH: TSH: 2.31 u[IU]/mL (ref 0.450–4.500)

## 2018-01-22 LAB — PROTIME-INR
INR: 1.1 (ref 0.8–1.2)
Prothrombin Time: 11.2 s (ref 9.1–12.0)

## 2018-01-25 ENCOUNTER — Other Ambulatory Visit: Payer: Self-pay | Admitting: Cardiovascular Disease

## 2018-01-25 ENCOUNTER — Ambulatory Visit
Admission: RE | Admit: 2018-01-25 | Discharge: 2018-01-25 | Disposition: A | Discharge status: Home / Self Care | Payer: Medicare HMO | Source: Ambulatory Visit | Attending: Cardiovascular Disease | Admitting: Cardiovascular Disease

## 2018-01-25 DIAGNOSIS — I350 Nonrheumatic aortic (valve) stenosis: Secondary | ICD-10-CM

## 2018-01-27 ENCOUNTER — Telehealth: Payer: Self-pay | Admitting: *Deleted

## 2018-01-27 NOTE — Telephone Encounter (Addendum)
[facility]: Thursday March 28,2019 1:30 PM Ar[facility] and [facility] at: 8:30 AM for IV hydration pre-cath Nothing to eat or drink after midnight prior to cath.  Hold: Lisinopril /HCT AM of cath Actos (pioglitazone) AM of cath  Except hold medications AM meds can be  taken pre-cath with sip of water including: ASA  Confirmed patient has responsible person to drive home post procedure and observe patient for 24 hours: yes  Cath instructions reviewed with patient, he verbalized understanding.

## 2018-01-27 NOTE — Addendum Note (Signed)
Addended by: Neta EhlersRUITT, Mackenzie Lia M on: 01/27/2018 03:55 PM   Modules accepted: Orders

## 2018-01-28 ENCOUNTER — Ambulatory Visit (HOSPITAL_COMMUNITY)
Admission: RE | Admit: 2018-01-28 | Discharge: 2018-01-28 | Disposition: A | Discharge status: Home / Self Care | Payer: Medicare HMO | Source: Ambulatory Visit | Attending: Cardiovascular Disease | Admitting: Cardiovascular Disease

## 2018-01-28 ENCOUNTER — Ambulatory Visit (HOSPITAL_COMMUNITY)
Admission: RE | Disposition: A | Discharge status: Home / Self Care | Payer: Self-pay | Source: Ambulatory Visit | Attending: Cardiovascular Disease

## 2018-01-28 DIAGNOSIS — Z79899 Other long term (current) drug therapy: Secondary | ICD-10-CM | POA: Diagnosis not present

## 2018-01-28 DIAGNOSIS — R0600 Dyspnea, unspecified: Secondary | ICD-10-CM | POA: Diagnosis not present

## 2018-01-28 DIAGNOSIS — Z9884 Bariatric surgery status: Secondary | ICD-10-CM | POA: Diagnosis not present

## 2018-01-28 DIAGNOSIS — Z888 Allergy status to other drugs, medicaments and biological substances status: Secondary | ICD-10-CM | POA: Insufficient documentation

## 2018-01-28 DIAGNOSIS — E785 Hyperlipidemia, unspecified: Secondary | ICD-10-CM | POA: Insufficient documentation

## 2018-01-28 DIAGNOSIS — I2510 Atherosclerotic heart disease of native coronary artery without angina pectoris: Secondary | ICD-10-CM | POA: Insufficient documentation

## 2018-01-28 DIAGNOSIS — Z7982 Long term (current) use of aspirin: Secondary | ICD-10-CM | POA: Diagnosis not present

## 2018-01-28 DIAGNOSIS — Z91030 Bee allergy status: Secondary | ICD-10-CM | POA: Insufficient documentation

## 2018-01-28 DIAGNOSIS — R0602 Shortness of breath: Secondary | ICD-10-CM | POA: Diagnosis not present

## 2018-01-28 DIAGNOSIS — E119 Type 2 diabetes mellitus without complications: Secondary | ICD-10-CM | POA: Insufficient documentation

## 2018-01-28 DIAGNOSIS — I350 Nonrheumatic aortic (valve) stenosis: Secondary | ICD-10-CM | POA: Diagnosis not present

## 2018-01-28 DIAGNOSIS — I10 Essential (primary) hypertension: Secondary | ICD-10-CM | POA: Insufficient documentation

## 2018-01-28 DIAGNOSIS — Z87891 Personal history of nicotine dependence: Secondary | ICD-10-CM | POA: Diagnosis not present

## 2018-01-28 HISTORY — PX: RIGHT/LEFT HEART CATH AND CORONARY ANGIOGRAPHY: CATH118266

## 2018-01-28 LAB — POCT I-STAT 3, VENOUS BLOOD GAS (G3P V)
Bicarbonate: 25.8 mmol/L (ref 20.0–28.0)
O2 Saturation: 65 %
TCO2: 27 mmol/L (ref 22–32)
pCO2, Ven: 46.8 mmHg (ref 44.0–60.0)
pH, Ven: 7.349 (ref 7.250–7.430)
pO2, Ven: 36 mmHg (ref 32.0–45.0)

## 2018-01-28 LAB — POCT I-STAT 3, ART BLOOD GAS (G3+)
Acid-base deficit: 1 mmol/L (ref 0.0–2.0)
Bicarbonate: 24.7 mmol/L (ref 20.0–28.0)
O2 Saturation: 98 %
TCO2: 26 mmol/L (ref 22–32)
pCO2 arterial: 42.9 mmHg (ref 32.0–48.0)
pH, Arterial: 7.368 (ref 7.350–7.450)
pO2, Arterial: 118 mmHg — ABNORMAL HIGH (ref 83.0–108.0)

## 2018-01-28 LAB — POCT ACTIVATED CLOTTING TIME: Activated Clotting Time: 186 s

## 2018-01-28 LAB — GLUCOSE, CAPILLARY
GLUCOSE-CAPILLARY: 125 mg/dL — AB (ref 65–99)
Glucose-Capillary: 104 mg/dL — ABNORMAL HIGH (ref 65–99)

## 2018-01-28 SURGERY — RIGHT/LEFT HEART CATH AND CORONARY ANGIOGRAPHY
Anesthesia: LOCAL

## 2018-01-28 MED ORDER — HEPARIN SODIUM (PORCINE) 1000 UNIT/ML IJ SOLN
INTRAMUSCULAR | Status: AC
  Filled 2018-01-28: qty 1

## 2018-01-28 MED ORDER — LIDOCAINE HCL (PF) 1 % IJ SOLN
INTRAMUSCULAR | Status: DC | PRN
  Administered 2018-01-28: 16 mL
  Administered 2018-01-28: 2 mL

## 2018-01-28 MED ORDER — IOPAMIDOL (ISOVUE-370) INJECTION 76%
INTRAVENOUS | Status: DC | PRN
  Administered 2018-01-28: 50 mL via INTRA_ARTERIAL

## 2018-01-28 MED ORDER — ASPIRIN 81 MG PO CHEW: 81.0000 mg | CHEWABLE_TABLET | ORAL | Status: DC

## 2018-01-28 MED ORDER — SODIUM CHLORIDE 0.9 % WEIGHT BASED INFUSION: 3.0000 mL/kg/h | INTRAVENOUS | Status: AC

## 2018-01-28 MED ORDER — FENTANYL CITRATE (PF) 100 MCG/2ML IJ SOLN
INTRAMUSCULAR | Status: AC
  Filled 2018-01-28: qty 2

## 2018-01-28 MED ORDER — MORPHINE SULFATE (PF) 4 MG/ML IV SOLN
INTRAVENOUS | Status: AC
  Filled 2018-01-28: qty 1

## 2018-01-28 MED ORDER — MIDAZOLAM HCL 2 MG/2ML IJ SOLN
INTRAMUSCULAR | Status: AC
  Filled 2018-01-28: qty 2

## 2018-01-28 MED ORDER — ACETAMINOPHEN 325 MG PO TABS: 650.0000 mg | ORAL_TABLET | ORAL | Status: DC | PRN

## 2018-01-28 MED ORDER — OXYCODONE HCL 5 MG PO TABS
5.0000 mg | ORAL_TABLET | Freq: Once | ORAL | Status: AC
  Administered 2018-01-28: 5 mg via ORAL

## 2018-01-28 MED ORDER — SODIUM CHLORIDE 0.9 % IV SOLN: 250.0000 mL | INTRAVENOUS | Status: DC | PRN

## 2018-01-28 MED ORDER — MORPHINE SULFATE (PF) 10 MG/ML IV SOLN
2.0000 mg | INTRAVENOUS | Status: DC | PRN
  Administered 2018-01-28: 2 mg via INTRAVENOUS

## 2018-01-28 MED ORDER — VERAPAMIL HCL 2.5 MG/ML IV SOLN
INTRAVENOUS | Status: DC | PRN
  Administered 2018-01-28: 10 mL via INTRA_ARTERIAL

## 2018-01-28 MED ORDER — VERAPAMIL HCL 2.5 MG/ML IV SOLN
INTRAVENOUS | Status: AC
  Filled 2018-01-28: qty 2

## 2018-01-28 MED ORDER — IOPAMIDOL (ISOVUE-370) INJECTION 76%
INTRAVENOUS | Status: AC
  Filled 2018-01-28: qty 100

## 2018-01-28 MED ORDER — ONDANSETRON HCL 4 MG/2ML IJ SOLN: 4.0000 mg | Freq: Four times a day (QID) | INTRAMUSCULAR | Status: DC | PRN

## 2018-01-28 MED ORDER — LIDOCAINE HCL (PF) 1 % IJ SOLN
INTRAMUSCULAR | Status: AC
  Filled 2018-01-28: qty 30

## 2018-01-28 MED ORDER — SODIUM CHLORIDE 0.9 % IV SOLN: INTRAVENOUS | Status: AC

## 2018-01-28 MED ORDER — MIDAZOLAM HCL 2 MG/2ML IJ SOLN
INTRAMUSCULAR | Status: DC | PRN
  Administered 2018-01-28: 1 mg via INTRAVENOUS

## 2018-01-28 MED ORDER — SODIUM CHLORIDE 0.9% FLUSH: 3.0000 mL | Freq: Two times a day (BID) | INTRAVENOUS | Status: DC

## 2018-01-28 MED ORDER — HEPARIN (PORCINE) IN NACL 2-0.9 UNIT/ML-% IJ SOLN
INTRAMUSCULAR | Status: AC
  Filled 2018-01-28: qty 1000

## 2018-01-28 MED ORDER — SODIUM CHLORIDE 0.9% FLUSH: 3.0000 mL | INTRAVENOUS | Status: DC | PRN

## 2018-01-28 MED ORDER — HEPARIN (PORCINE) IN NACL 2-0.9 UNIT/ML-% IJ SOLN
INTRAMUSCULAR | Status: AC
  Filled 2018-01-28: qty 500

## 2018-01-28 MED ORDER — SODIUM CHLORIDE 0.9 % WEIGHT BASED INFUSION: 1.0000 mL/kg/h | INTRAVENOUS | Status: DC

## 2018-01-28 MED ORDER — HEPARIN SODIUM (PORCINE) 1000 UNIT/ML IJ SOLN
INTRAMUSCULAR | Status: DC | PRN
  Administered 2018-01-28: 7000 [IU] via INTRAVENOUS

## 2018-01-28 MED ORDER — FENTANYL CITRATE (PF) 100 MCG/2ML IJ SOLN
INTRAMUSCULAR | Status: DC | PRN
  Administered 2018-01-28 (×2): 25 ug via INTRAVENOUS

## 2018-01-28 MED ORDER — HEPARIN (PORCINE) IN NACL 2-0.9 UNIT/ML-% IJ SOLN
INTRAMUSCULAR | Status: AC | PRN
  Administered 2018-01-28 (×3): 500 mL

## 2018-01-28 MED ORDER — OXYCODONE HCL 5 MG PO TABS
ORAL_TABLET | ORAL | Status: AC
  Administered 2018-01-28: 5 mg via ORAL
  Filled 2018-01-28: qty 3

## 2018-01-28 SURGICAL SUPPLY — 18 items
BAND ZEPHYR COMPRESS 30 LONG (HEMOSTASIS) ×2 IMPLANT
CATH INFINITI JR4 5F (CATHETERS) ×2 IMPLANT
CATH OPTITORQUE TIG 4.0 5F (CATHETERS) ×2 IMPLANT
CATH SWAN GANZ 7F STRAIGHT (CATHETERS) ×2 IMPLANT
GLIDESHEATH SLEND A-KIT 6F 22G (SHEATH) ×2 IMPLANT
GUIDEWIRE INQWIRE 1.5J.035X260 (WIRE) ×1 IMPLANT
INQWIRE 1.5J .035X260CM (WIRE) ×2
KIT HEART LEFT (KITS) ×2 IMPLANT
PACK CARDIAC CATHETERIZATION (CUSTOM PROCEDURE TRAY) ×2 IMPLANT
SHEATH AVANTI 11CM 5FR (SHEATH) IMPLANT
SHEATH AVANTI 11CM 7FR (SHEATH) ×2 IMPLANT
SHEATH RAIN RADIAL 21G 6FR (SHEATH) IMPLANT
TRANSDUCER W/STOPCOCK (MISCELLANEOUS) ×2 IMPLANT
TUBING CIL FLEX 10 FLL-RA (TUBING) ×2 IMPLANT
WIRE EMERALD 3MM-J .025X260CM (WIRE) ×2 IMPLANT
WIRE EMERALD 3MM-J .035X150CM (WIRE) ×2 IMPLANT
WIRE EMERALD ST .035X260CM (WIRE) ×2 IMPLANT
WIRE HI TORQ VERSACORE-J 145CM (WIRE) ×2 IMPLANT

## 2018-01-28 NOTE — Progress Notes (Signed)
Site area: LFV Site Prior to Removal:  Level 0 Pressure Applied For:10 min Manual:  yes  Patient Status During Pull: stable  Post Pull Site:  Level 0 Post Pull Instructions Given:  yes Post Pull Pulses Present:  Dressing Applied:  tegaderm Bedrest begins @ 1700 Comments:

## 2018-01-28 NOTE — Interval H&P Note (Signed)
Cath Lab Visit (complete for each Cath Lab visit)  Clinical Evaluation Leading to the Procedure:   ACS: No.  Non-ACS:    Anginal Classification: CCS II  Anti-ischemic medical therapy: Minimal Therapy (1 class of medications)  Non-Invasive Test Results: Intermediate-risk stress test findings: cardiac mortality 1-3%/year  Prior CABG: No previous CABG      History and Physical Interval Note:  01/28/2018 3:37 PM  [name]  has presented today for surgery, with the diagnosis of moderate aortic stenosis  The various methods of treatment have been discussed with the patient and family. After consideration of risks, benefits and other options for treatment, the patient has consented to  Procedure(s): RIGHT/LEFT HEART CATH AND CORONARY ANGIOGRAPHY (N/A) as a surgical intervention .  The patient's history has been reviewed, patient examined, no change in status, stable for surgery.  I have reviewed the patient's chart and labs.  Questions were answered to the patient's satisfaction.     Nanetta BattyJonathan Dashana Guizar

## 2018-01-28 NOTE — Discharge Instructions (Signed)
Radial Site Care Refer to this sheet in the next few weeks. These instructions provide you with information about caring for yourself after your procedure. Your health care provider may also give you more specific instructions. Your treatment has been planned according to current medical practices, but problems sometimes occur. Call your health care provider if you have any problems or questions after your procedure. What can I expect after the procedure? After your procedure, it is typical to have the following:  Bruising at the radial site that usually fades within 1-2 weeks.  Blood collecting in the tissue (hematoma) that may be painful to the touch. It should usually decrease in size and tenderness within 1-2 weeks.  Follow these instructions at home:  Take medicines only as directed by your health care provider.  You may shower 24-48 hours after the procedure or as directed by your health care provider. Remove the bandage (dressing) and gently wash the site with plain soap and water. Pat the area dry with a clean towel. Do not rub the site, because this may cause bleeding.  Do not take baths, swim, or use a hot tub until your health care provider approves.  Check your insertion site every day for redness, swelling, or drainage.  Do not apply powder or lotion to the site.  Do not flex or bend the affected arm for 24 hours or as directed by your health care provider.  Do not push or pull heavy objects with the affected arm for 24 hours or as directed by your health care provider.  Do not lift over 10 lb (4.5 kg) for 5 days after your procedure or as directed by your health care provider.  Ask your health care provider when it is okay to: ? Return to work or school. ? Resume usual physical activities or sports. ? Resume sexual activity.  Do not drive home if you are discharged the same day as the procedure. Have someone else drive you.  You may drive 24 hours after the procedure  unless otherwise instructed by your health care provider.  Do not operate machinery or power tools for 24 hours after the procedure.  If your procedure was done as an outpatient procedure, which means that you went home the same day as your procedure, a responsible adult should be with you for the first 24 hours after you arrive home.  Keep all follow-up visits as directed by your health care provider. This is important. Contact a health care provider if:  You have a fever.  You have chills.  You have increased bleeding from the radial site. Hold pressure on the site. CALL 911 Get help right away if:  You have unusual pain at the radial site.  You have redness, warmth, or swelling at the radial site.  You have drainage (other than a small amount of blood on the dressing) from the radial site.  The radial site is bleeding, and the bleeding does not stop after 30 minutes of holding steady pressure on the site.  Your arm or hand becomes pale, cool, tingly, or numb. This information is not intended to replace advice given to you by your health care provider. Make sure you discuss any questions you have with your health care provider. Document Released: 11/22/2010 Document Revised: 03/27/2016 Document Reviewed: 05/08/2014 Elsevier Interactive Patient Education  2018 [place] [employer] This sheet gives you information about how to care for yourself after your procedure. Your health care provider may also  give you more specific instructions. If you have problems or questions, contact your health care provider. What can I expect after the procedure? After the procedure, it is common to have bruising and tenderness at the catheter insertion area. Follow these instructions at home: Insertion site care  Follow instructions from your health care provider about how to take care of your insertion site. Make sure you: ? Wash your hands with soap and water before you change  your bandage (dressing). If soap and water are not available, use hand sanitizer. ? Change your dressing as told by your health care provider. ? Leave stitches (sutures), skin glue, or adhesive strips in place. These skin closures may need to stay in place for 2 weeks or longer. If adhesive strip edges start to loosen and curl up, you may trim the loose edges. Do not remove adhesive strips completely unless your health care provider tells you to do that.  Do not take baths, swim, or use a hot tub until your health care provider approves.  You may shower 24-48 hours after the procedure or as told by your health care provider. ? Gently wash the site with plain soap and water. ? Pat the area dry with a clean towel. ? Do not rub the site. This may cause bleeding.  Do not apply powder or lotion to the site. Keep the site clean and dry.  Check your insertion site every day for signs of infection. Check for: ? Redness, swelling, or pain. ? Fluid or blood. ? Warmth. ? Pus or a bad smell. Activity  Rest as told by your health care provider, usually for 1-2 days.  Do not lift anything that is heavier than 10 lbs. (4.5 kg) or as told by your health care provider.  Do not drive for 24 hours if you were given a medicine to help you relax (sedative).  Do not drive or use heavy machinery while taking prescription pain medicine. General instructions  Return to your normal activities as told by your health care provider, usually in about a week. Ask your health care provider what activities are safe for you.  If the catheter site starts bleeding, lie flat and put pressure on the site. If the bleeding does not stop, get help right away. This is a medical emergency.  Drink enough fluid to keep your urine clear or pale yellow. This helps flush the contrast dye from your body.  Take over-the-counter and prescription medicines only as told by your health care provider.  Keep all follow-up visits as  told by your health care provider. This is important. Contact a health care provider if:  You have a fever or chills.  You have redness, swelling, or pain around your insertion site.  You have fluid or blood coming from your insertion site.  The insertion site feels warm to the touch.  You have pus or a bad smell coming from your insertion site.  You have bruising around the insertion site.  You notice blood collecting in the tissue around the catheter site (hematoma). The hematoma may be painful to the touch. Get help right away if:  You have severe pain at the catheter insertion area.  The catheter insertion area swells very fast.  The catheter insertion area is bleeding, and the bleeding does not stop when you hold steady pressure on the area.  The area near or just beyond the catheter insertion site becomes pale, cool, tingly, or numb. These symptoms may represent a serious  that is an emergency. Do not wait to see if the symptoms will go away. Get medical help right away. Call your local emergency services (911 in the [place] Do not drive yourself to the hospital. °Summary °· After the procedure, it is common to have bruising and tenderness at the catheter insertion area. °· After the procedure, it is important to rest and drink plenty of fluids. °· Do not take baths, swim, or use a hot tub until your health care provider says it is okay to do so. You may shower 24-48 hours after the procedure or as told by your health care provider. °· If the catheter site starts bleeding, lie flat and put pressure on the site. If the bleeding does not stop, get help right away. This is a medical emergency. °This information is not intended to replace advice given to you by your health care provider. Make sure you discuss any questions you have with your health care provider. °Document Released: 05/08/2005 Document Revised: 09/24/2016 Document Reviewed: 09/24/2016 °Elsevier Interactive Patient Education  © 2018 [employer] ° °

## 2018-01-29 ENCOUNTER — Encounter (HOSPITAL_COMMUNITY): Payer: Self-pay | Admitting: Cardiovascular Disease

## 2018-01-29 MED FILL — Heparin Sodium (Porcine) 2 Unit/ML in Sodium Chloride 0.9%: INTRAMUSCULAR | Qty: 2000 | Status: AC

## 2018-02-12 ENCOUNTER — Ambulatory Visit: Payer: Medicare HMO | Admitting: Cardiovascular Disease

## 2018-02-16 ENCOUNTER — Ambulatory Visit: Payer: Self-pay | Admitting: Cardiology

## 2019-05-13 ENCOUNTER — Other Ambulatory Visit: Payer: Self-pay

## 2019-05-13 DIAGNOSIS — I739 Peripheral vascular disease, unspecified: Secondary | ICD-10-CM

## 2019-05-16 ENCOUNTER — Telehealth (HOSPITAL_COMMUNITY): Payer: Self-pay | Admitting: *Deleted

## 2019-05-16 NOTE — Telephone Encounter (Signed)
The above patient or their representative was contacted and gave the following answers to these questions:         Do you have any of the following symptoms?n  Fever                    Cough                   Shortness of breath  Do  you have any of the following other symptoms? n   muscle pain         vomiting,        diarrhea        rash         weakness        red eye        abdominal pain         bruising          bruising or bleeding              joint pain           severe headache    Have you been in contact with someone who was or has been sick in the past 2 weeks?n  Yes                 Unsure                         Unable to assess   Does the person that you were in contact with have any of the following symptoms?   Cough         shortness of breath           muscle pain         vomiting,            diarrhea            rash            weakness           fever            red eye           abdominal pain           bruising  or  bleeding                joint pain                severe headache               Have you  or someone you have been in contact with traveled internationally in th last month?   n      If yes, which countries?   Have you  or someone you have been in contact with traveled outside [place] in th last month?   n      If yes, which state and city?   COMMENTS OR ACTION PLAN FOR THIS PATIENT:         

## 2019-05-17 ENCOUNTER — Encounter: Payer: Self-pay | Admitting: *Deleted

## 2019-05-17 ENCOUNTER — Other Ambulatory Visit: Payer: Self-pay | Admitting: *Deleted

## 2019-05-17 ENCOUNTER — Ambulatory Visit (INDEPENDENT_AMBULATORY_CARE_PROVIDER_SITE_OTHER): Payer: Medicare HMO | Admitting: Vascular Surgery

## 2019-05-17 ENCOUNTER — Ambulatory Visit (HOSPITAL_COMMUNITY)
Admission: RE | Admit: 2019-05-17 | Discharge: 2019-05-17 | Disposition: A | Discharge status: Home / Self Care | Payer: Medicare HMO | Source: Ambulatory Visit | Attending: Vascular Surgery | Admitting: Vascular Surgery

## 2019-05-17 ENCOUNTER — Encounter: Payer: Self-pay | Admitting: Vascular Surgery

## 2019-05-17 ENCOUNTER — Other Ambulatory Visit: Payer: Self-pay

## 2019-05-17 DIAGNOSIS — I739 Peripheral vascular disease, unspecified: Secondary | ICD-10-CM | POA: Insufficient documentation

## 2019-05-17 NOTE — Progress Notes (Signed)
Patient name: [name] MRN: [id number] DOB: 30-Aug-1951 Sex: male  REASON FOR CONSULT: Evaluate for PVD  HPI: [name] is a 68 y.o. male, with history of hypertension, hyperlipidemia, diabetes, chronic back pain, morbid obesity that presents for evaluation of  PVD.  Patient describes years of lower extremity pain in his calfs and thighs when he walks consistent with claudication.  He states this has been progressing severely over the last 3 to 6 months or so.  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  He is also the primary caregiver at home and states he has to cook and do house duties for his wife who is severely disabled with COPD and states he is having trouble getting around the house.  In addition he is starting to have some discomfort in the legs at night and is being treated for restless leg syndrome.  He denies any previous lower extremity wounds or tissue loss.  He states his right leg is worse than his left. He was evaluated by [facility] Willette [facility]han [facility] last year but states that he had no official follow-up for intervention - but states an intervention was offered.  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  Past Medical History:  Diagnosis Date  . Anemia   . Chronic back pain   . Depression   . Diabetes mellitus without complication (HCC)   . Hyperlipidemia   . Hypertension     Past Surgical History:  Procedure Laterality Date  . ACHILLES TENDON REPAIR Right   . GASTRIC BYPASS    . REPLACEMENT TOTAL KNEE Right   . RIGHT/LEFT HEART CATH AND CORONARY ANGIOGRAPHY N/A 01/28/2018   Procedure: RIGHT/LEFT HEART CATH AND CORONARY ANGIOGRAPHY;  Surgeon: Runell GessBerry, Jonathan J, MD;  Location: [facility]  INVASIVE CV LAB;  Service: Cardiovascular;  Laterality: N/A;    Family History  Problem Relation Age of Onset  . Depression Mother   . Anxiety disorder Mother   . Hypertension Mother   . Depression Father   . Diabetes Mellitus II Father   . Anxiety disorder Father   . Hypertension Father     SOCIAL HISTORY: Social History   Socioeconomic History  . Marital status: Single    Spouse name: Not on file  . Number of children: Not on file  . Years of education: Not on file  . Highest education level: Not on file  Occupational History  . Not on file  Social Needs  . Financial resource strain: Not on file  . Food insecurity    Worry: Not on file    Inability: Not on file  . Transportation needs    Medical: Not on file    Non-medical: Not on file  Tobacco Use  . Smoking status: Former Games developermoker  . Smokeless tobacco: Never Used  Substance and Sexual Activity  . Alcohol use: No    Frequency: Never  . Drug use: No  . Sexual activity: Not on file  Lifestyle  . Physical activity    Days per week: Not on file    Minutes per session: Not on file  . Stress: Not on file  Relationships  . Social connections  Talks on phone: Not on file    Gets together: Not on file    Attends religious service: Not on file    Active member of club or organization: Not on file    Attends meetings of clubs or organizations: Not on file    Relationship status: Not on file  . Intimate partner violence    Fear of current or ex partner: Not on file    Emotionally abused: Not on file    Physically abused: Not on file    Forced sexual activity: Not on file  Other Topics Concern  . Not on file  Social History Narrative  . Not on file    Allergies  Allergen Reactions  . Atorvastatin Other (See Comments)  . Bee Venom Swelling    Current Outpatient Medications  Medication Sig Dispense Refill  . aspirin EC 325 MG tablet Take 325 mg by mouth at bedtime.     Marland Kitchen. buPROPion (WELLBUTRIN) 100 MG  tablet Take 100 mg by mouth.    . gabapentin (NEURONTIN) 300 MG capsule Take 900 mg by mouth at bedtime.     Marland Kitchen. lisinopril-hydrochlorothiazide (PRINZIDE,ZESTORETIC) 20-12.5 MG tablet TAKE 1 TABLET by mouth daily    . Multiple Vitamins-Minerals (CENTRUM SILVER 50+MEN PO) Take 1 tablet by mouth daily.    Marland Kitchen. omeprazole (PRILOSEC) 40 MG capsule Take 40 mg by mouth 2 (two) times daily.    Marland Kitchen. oxyCODONE (ROXICODONE) 15 MG immediate release tablet Take 15 mg by mouth 4 (four) times daily.  0  . pioglitazone (ACTOS) 30 MG tablet Take 30 mg by mouth daily.  3  . pravastatin (PRAVACHOL) 20 MG tablet Take 20 mg by mouth at bedtime.    Marland Kitchen. rOPINIRole (REQUIP) 2 MG tablet TAKE 2 mg TWICE DAILY    . sertraline (ZOLOFT) 100 MG tablet Take 100 mg by mouth daily.     No current facility-administered medications for this visit.     REVIEW OF SYSTEMS:  [X]  denotes positive finding, [ ]  denotes negative finding Cardiac  Comments:  Chest pain or chest pressure:    Shortness of breath upon exertion:    Short of breath when lying flat:    Irregular heart rhythm:        Vascular    Pain in calf, thigh, or hip brought on by ambulation: x Right worse than left  Pain in feet at night that wakes you up from your sleep:  x Being treated for restless legs as well  Blood clot in your veins:    Leg swelling:         Pulmonary    Oxygen at home:    Productive cough:     Wheezing:         Neurologic    Sudden weakness in arms or legs:     Sudden numbness in arms or legs:     Sudden onset of difficulty speaking or slurred speech:    Temporary loss of vision in one eye:     Problems with dizziness:         Gastrointestinal    Blood in stool:     Vomited blood:         Genitourinary    Burning when urinating:     Blood in urine:        Psychiatric    Major depression:         Hematologic    Bleeding problems:    Problems with blood clotting too easily:  Skin    Rashes or ulcers:         Constitutional    Fever or chills:      PHYSICAL EXAM: Vitals:   05/17/19 0955  BP: 131/67  Pulse: 62  Resp: 20  Temp: (!) 97.3 F (36.3 C)  SpO2: 98%  Weight: (!) 328 lb (148.8 kg)  Height: 5\' 7"  (1.702 m)    GENERAL: The patient is a well-nourished male, in no acute distress. The vital signs are documented above. CARDIAC: There is a regular rate and rhythm.  VASCULAR:  2+ radial pulse palpable bilaterally No palpable right femoral pulse Weak left femoral pulse palpable No pedal or popliteal pulses palpable PULMONARY: There is good air exchange bilaterally without wheezing or rales. ABDOMEN: Soft and non-tender with normal pitched bowel sounds. Obese.  Large pannus. MUSCULOSKELETAL: There are no major deformities or cyanosis. NEUROLOGIC: No focal weakness or paresthesias are detected. SKIN: There are no ulcers or rashes noted. PSYCHIATRIC: The patient has a normal affect.  DATA:   I independently reviewed his ABIs which are 0.92 on the right and monophasic and 1.04 on the left and monophasic severely depressed toe pressures  Assessment/Plan:  68 year old male with multiple medical problems as noted above the presents with progressive lifestyle limiting short distance claudication with concern for some early rest pain symptoms particularly in the right leg.   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  I can appreciate a weak left femoral pulse. Discussed plan for aortogram with bilateral lower extremity arteriogram in the Cath Lab.  Discussed this would likely be diagnostic and given that his right leg is worse probably make plans for right common femoral endarterectomy with retrograde iliac stenting in the OR.  However we will get some updated imaging to ensure he does not need anything further.    Marty Heck, MD [facility] Office: [phone] Pager: [phone]

## 2019-06-07 ENCOUNTER — Other Ambulatory Visit (HOSPITAL_COMMUNITY)
Admission: RE | Admit: 2019-06-07 | Discharge: 2019-06-07 | Disposition: A | Discharge status: Home / Self Care | Payer: Medicare HMO | Source: Ambulatory Visit | Attending: Vascular Surgery | Admitting: Vascular Surgery

## 2019-06-07 DIAGNOSIS — Z01812 Encounter for preprocedural laboratory examination: Secondary | ICD-10-CM | POA: Insufficient documentation

## 2019-06-07 DIAGNOSIS — Z20828 Contact with and (suspected) exposure to other viral communicable diseases: Secondary | ICD-10-CM | POA: Insufficient documentation

## 2019-06-07 LAB — SARS CORONAVIRUS 2 (TAT 6-24 HRS): SARS Coronavirus 2: NEGATIVE

## 2019-06-08 ENCOUNTER — Encounter (HOSPITAL_COMMUNITY)
Admission: RE | Disposition: A | Discharge status: Home / Self Care | Payer: Self-pay | Source: Home / Self Care | Attending: Vascular Surgery

## 2019-06-08 ENCOUNTER — Other Ambulatory Visit: Payer: Self-pay

## 2019-06-08 ENCOUNTER — Other Ambulatory Visit: Payer: Self-pay | Admitting: *Deleted

## 2019-06-08 ENCOUNTER — Ambulatory Visit (HOSPITAL_COMMUNITY)
Admission: RE | Admit: 2019-06-08 | Discharge: 2019-06-08 | Disposition: A | Discharge status: Home / Self Care | Payer: Medicare HMO | Attending: Vascular Surgery | Admitting: Vascular Surgery

## 2019-06-08 DIAGNOSIS — I70223 Atherosclerosis of native arteries of extremities with rest pain, bilateral legs: Secondary | ICD-10-CM | POA: Insufficient documentation

## 2019-06-08 DIAGNOSIS — I129 Hypertensive chronic kidney disease with stage 1 through stage 4 chronic kidney disease, or unspecified chronic kidney disease: Secondary | ICD-10-CM | POA: Diagnosis not present

## 2019-06-08 DIAGNOSIS — Z9884 Bariatric surgery status: Secondary | ICD-10-CM | POA: Insufficient documentation

## 2019-06-08 DIAGNOSIS — N189 Chronic kidney disease, unspecified: Secondary | ICD-10-CM | POA: Diagnosis not present

## 2019-06-08 DIAGNOSIS — G2581 Restless legs syndrome: Secondary | ICD-10-CM | POA: Diagnosis not present

## 2019-06-08 DIAGNOSIS — G8929 Other chronic pain: Secondary | ICD-10-CM | POA: Diagnosis not present

## 2019-06-08 DIAGNOSIS — Z87891 Personal history of nicotine dependence: Secondary | ICD-10-CM | POA: Diagnosis not present

## 2019-06-08 DIAGNOSIS — F329 Major depressive disorder, single episode, unspecified: Secondary | ICD-10-CM | POA: Insufficient documentation

## 2019-06-08 DIAGNOSIS — Z8249 Family history of ischemic heart disease and other diseases of the circulatory system: Secondary | ICD-10-CM | POA: Diagnosis not present

## 2019-06-08 DIAGNOSIS — E1122 Type 2 diabetes mellitus with diabetic chronic kidney disease: Secondary | ICD-10-CM | POA: Diagnosis not present

## 2019-06-08 DIAGNOSIS — Z7982 Long term (current) use of aspirin: Secondary | ICD-10-CM | POA: Insufficient documentation

## 2019-06-08 DIAGNOSIS — E1151 Type 2 diabetes mellitus with diabetic peripheral angiopathy without gangrene: Secondary | ICD-10-CM | POA: Insufficient documentation

## 2019-06-08 DIAGNOSIS — E785 Hyperlipidemia, unspecified: Secondary | ICD-10-CM | POA: Insufficient documentation

## 2019-06-08 DIAGNOSIS — Z7984 Long term (current) use of oral hypoglycemic drugs: Secondary | ICD-10-CM | POA: Insufficient documentation

## 2019-06-08 DIAGNOSIS — Z79899 Other long term (current) drug therapy: Secondary | ICD-10-CM | POA: Insufficient documentation

## 2019-06-08 DIAGNOSIS — I70213 Atherosclerosis of native arteries of extremities with intermittent claudication, bilateral legs: Secondary | ICD-10-CM | POA: Diagnosis not present

## 2019-06-08 DIAGNOSIS — Z6841 Body Mass Index (BMI) 40.0 and over, adult: Secondary | ICD-10-CM | POA: Diagnosis not present

## 2019-06-08 HISTORY — PX: ABDOMINAL AORTOGRAM W/LOWER EXTREMITY: CATH118223

## 2019-06-08 LAB — GLUCOSE, CAPILLARY: Glucose-Capillary: 119 mg/dL — ABNORMAL HIGH (ref 70–99)

## 2019-06-08 LAB — POCT I-STAT, CHEM 8
BUN: 34 mg/dL — ABNORMAL HIGH (ref 8–23)
Calcium, Ion: 1.11 mmol/L — ABNORMAL LOW (ref 1.15–1.40)
Chloride: 97 mmol/L — ABNORMAL LOW (ref 98–111)
Creatinine, Ser: 1.8 mg/dL — ABNORMAL HIGH (ref 0.61–1.24)
Glucose, Bld: 124 mg/dL — ABNORMAL HIGH (ref 70–99)
HCT: 32 % — ABNORMAL LOW (ref 39.0–52.0)
Hemoglobin: 10.9 g/dL — ABNORMAL LOW (ref 13.0–17.0)
Potassium: 4.6 mmol/L (ref 3.5–5.1)
Sodium: 132 mmol/L — ABNORMAL LOW (ref 135–145)
TCO2: 27 mmol/L (ref 22–32)

## 2019-06-08 LAB — POCT ACTIVATED CLOTTING TIME: Activated Clotting Time: 169 seconds

## 2019-06-08 SURGERY — ABDOMINAL AORTOGRAM W/LOWER EXTREMITY
Anesthesia: LOCAL

## 2019-06-08 MED ORDER — SODIUM CHLORIDE 0.9 % IV SOLN: INTRAVENOUS | Status: DC

## 2019-06-08 MED ORDER — SODIUM CHLORIDE 0.9% FLUSH: 3.0000 mL | INTRAVENOUS | Status: DC | PRN

## 2019-06-08 MED ORDER — LIDOCAINE HCL (PF) 1 % IJ SOLN
INTRAMUSCULAR | Status: AC
  Filled 2019-06-08: qty 30

## 2019-06-08 MED ORDER — HEPARIN SODIUM (PORCINE) 1000 UNIT/ML IJ SOLN
INTRAMUSCULAR | Status: AC
  Filled 2019-06-08: qty 1

## 2019-06-08 MED ORDER — HEPARIN (PORCINE) IN NACL 1000-0.9 UT/500ML-% IV SOLN
INTRAVENOUS | Status: AC
  Filled 2019-06-08: qty 1000

## 2019-06-08 MED ORDER — MIDAZOLAM HCL 2 MG/2ML IJ SOLN
INTRAMUSCULAR | Status: AC
  Filled 2019-06-08: qty 2

## 2019-06-08 MED ORDER — SODIUM CHLORIDE 0.9% FLUSH: 3.0000 mL | Freq: Two times a day (BID) | INTRAVENOUS | Status: DC

## 2019-06-08 MED ORDER — HEPARIN SODIUM (PORCINE) 1000 UNIT/ML IJ SOLN
INTRAMUSCULAR | Status: DC | PRN
  Administered 2019-06-08: 5000 [IU] via INTRAVENOUS

## 2019-06-08 MED ORDER — FENTANYL CITRATE (PF) 100 MCG/2ML IJ SOLN
INTRAMUSCULAR | Status: AC
  Filled 2019-06-08: qty 2

## 2019-06-08 MED ORDER — HYDRALAZINE HCL 20 MG/ML IJ SOLN: 5.0000 mg | INTRAMUSCULAR | Status: DC | PRN

## 2019-06-08 MED ORDER — ONDANSETRON HCL 4 MG/2ML IJ SOLN: 4.0000 mg | Freq: Four times a day (QID) | INTRAMUSCULAR | Status: DC | PRN

## 2019-06-08 MED ORDER — SODIUM CHLORIDE 0.9 % IV SOLN
INTRAVENOUS | Status: DC
  Administered 2019-06-08: 07:00:00 via INTRAVENOUS

## 2019-06-08 MED ORDER — ACETAMINOPHEN 325 MG PO TABS: 650.0000 mg | ORAL_TABLET | ORAL | Status: DC | PRN

## 2019-06-08 MED ORDER — IODIXANOL 320 MG/ML IV SOLN
INTRAVENOUS | Status: DC | PRN
  Administered 2019-06-08: 10:00:00 15 mL via INTRA_ARTERIAL

## 2019-06-08 MED ORDER — SODIUM CHLORIDE 0.9 % IV SOLN: 250.0000 mL | INTRAVENOUS | Status: DC | PRN

## 2019-06-08 MED ORDER — HEPARIN (PORCINE) IN NACL 1000-0.9 UT/500ML-% IV SOLN
INTRAVENOUS | Status: DC | PRN
  Administered 2019-06-08 (×2): 500 mL

## 2019-06-08 MED ORDER — MIDAZOLAM HCL 2 MG/2ML IJ SOLN
INTRAMUSCULAR | Status: DC | PRN
  Administered 2019-06-08: 1 mg via INTRAVENOUS

## 2019-06-08 MED ORDER — LABETALOL HCL 5 MG/ML IV SOLN: 10.0000 mg | INTRAVENOUS | Status: DC | PRN

## 2019-06-08 MED ORDER — FENTANYL CITRATE (PF) 100 MCG/2ML IJ SOLN
INTRAMUSCULAR | Status: DC | PRN
  Administered 2019-06-08: 25 ug via INTRAVENOUS

## 2019-06-08 MED ORDER — LIDOCAINE HCL (PF) 1 % IJ SOLN
INTRAMUSCULAR | Status: DC | PRN
  Administered 2019-06-08: 15 mL via INTRADERMAL

## 2019-06-08 SURGICAL SUPPLY — 13 items
CATH OMNI FLUSH 5F 65CM (CATHETERS) ×2 IMPLANT
FILTER CO2 0.2 MICRON (VASCULAR PRODUCTS) ×2 IMPLANT
HOVERMATT SINGLE USE (MISCELLANEOUS) ×2 IMPLANT
KIT MICROPUNCTURE NIT STIFF (SHEATH) ×2 IMPLANT
KIT PV (KITS) ×2 IMPLANT
RESERVOIR CO2 (VASCULAR PRODUCTS) ×2 IMPLANT
SET FLUSH CO2 (MISCELLANEOUS) ×2 IMPLANT
SHEATH PINNACLE 5F 10CM (SHEATH) ×2 IMPLANT
SHEATH PROBE COVER 6X72 (BAG) ×2 IMPLANT
SYR MEDRAD MARK V 150ML (SYRINGE) ×2 IMPLANT
TRANSDUCER W/STOPCOCK (MISCELLANEOUS) ×2 IMPLANT
TRAY PV CATH (CUSTOM PROCEDURE TRAY) ×2 IMPLANT
WIRE BENTSON .035X145CM (WIRE) ×2 IMPLANT

## 2019-06-08 NOTE — Progress Notes (Addendum)
Discharge instructions reviewed with pt and Caryl Pina both voice understanding.

## 2019-06-08 NOTE — Discharge Instructions (Signed)
Femoral Site Care °This sheet gives you information about how to care for yourself after your procedure. Your health care provider may also give you more specific instructions. If you have problems or questions, contact your health care provider. °What can I expect after the procedure? °After the procedure, it is common to have: °· Bruising that usually fades within 1-2 weeks. °· Tenderness at the site. °Follow these instructions at home: °Wound care °· Follow instructions from your health care provider about how to take care of your insertion site. Make sure you: °? Wash your hands with soap and water before you change your bandage (dressing). If soap and water are not available, use hand sanitizer. °? Change your dressing as told by your health care provider. °? Leave stitches (sutures), skin glue, or adhesive strips in place. These skin closures may need to stay in place for 2 weeks or longer. If adhesive strip edges start to loosen and curl up, you may trim the loose edges. Do not remove adhesive strips completely unless your health care provider tells you to do that. °· Do not take baths, swim, or use a hot tub until your health care provider approves. °· You may shower 24-48 hours after the procedure or as told by your health care provider. °? Gently wash the site with plain soap and water. °? Pat the area dry with a clean towel. °? Do not rub the site. This may cause bleeding. °· Do not apply powder or lotion to the site. Keep the site clean and dry. °· Check your femoral site every day for signs of infection. Check for: °? Redness, swelling, or pain. °? Fluid or blood. °? Warmth. °? Pus or a bad smell. °Activity °· For the first 2-3 days after your procedure, or as long as directed: °? Avoid climbing stairs as much as possible. °? Do not squat. °· Do not lift anything that is heavier than 10 lb (4.5 kg), or the limit that you are told, until your health care provider says that it is safe. °· Rest as  directed. °? Avoid sitting for a long time without moving. Get up to take short walks every 1-2 hours. °· Do not drive for 24 hours if you were given a medicine to help you relax (sedative). °General instructions °· Take over-the-counter and prescription medicines only as told by your health care provider. °· Keep all follow-up visits as told by your health care provider. This is important. °Contact a health care provider if you have: °· A fever or chills. °· You have redness, swelling, or pain around your insertion site. °Get help right away if: °· The catheter insertion area swells very fast. °· You pass out. °· You suddenly start to sweat or your skin gets clammy. °· The catheter insertion area is bleeding, and the bleeding does not stop when you hold steady pressure on the area. °· The area near or just beyond the catheter insertion site becomes pale, cool, tingly, or numb. °These symptoms may represent a serious problem that is an emergency. Do not wait to see if the symptoms will go away. Get medical help right away. Call your local emergency services (911 in the [place] Do not drive yourself to the hospital. °Summary °· After the procedure, it is common to have bruising that usually fades within 1-2 weeks. °· Check your femoral site every day for signs of infection. °· Do not lift anything that is heavier than 10 lb (4.5 kg), or the   limit that you are told, until your health care provider says that it is safe. °This information is not intended to replace advice given to you by your health care provider. Make sure you discuss any questions you have with your health care provider. °Document Released: 06/23/2014 Document Revised: 11/02/2017 Document Reviewed: 11/02/2017 °Elsevier Patient Education © 2020 [employer] Inc[employer] ° °

## 2019-06-08 NOTE — H&P (Signed)
History and Physical Interval Note:  06/08/2019 8:46 AM  [name]  has presented today for surgery, with the diagnosis of pvd.  The various methods of treatment have been discussed with the patient and family. After consideration of risks, benefits and other options for treatment, the patient has consented to  Procedure(s): ABDOMINAL AORTOGRAM W/LOWER EXTREMITY (N/A) as a surgical intervention.  The patient's history has been reviewed, patient examined, no change in status, stable for surgery.  I have reviewed the patient's chart and labs.  Questions were answered to the patient's satisfaction.    Aortogram, bilateral lower extremity arteriogram.  CO2 if possible given CKD.  Cephus Shellinghristopher J Clora Ohmer  Patient name: [name]      MRN: [id number]        DOB: 1951-02-14          Sex: male  REASON FOR CONSULT: Evaluate for PVD  HPI: [name] is a 68 y.o. male, with history of hypertension, hyperlipidemia, diabetes, chronic back pain, morbid obesity that presents for evaluation of  PVD.  Patient describes years of lower extremity pain in his calfs and thighs when he walks consistent with claudication.  He states this has been progressing severely over the last 3 to 6 months or so.  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  He is also the primary caregiver at home and states he has to cook and do house duties for his wife who is severely disabled with COPD and states he is having trouble getting around the house.  In addition he is starting to have some discomfort in the legs at night and is being treated for restless leg syndrome.  He denies any previous lower extremity wounds or tissue loss.  He states his right leg is worse than his left. He was evaluated by Dr[facility]han at [facility] last year but states that he had no official follow-up for intervention - but states an intervention was offered.  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      Past Medical History:  Diagnosis Date  . Anemia   . Chronic back pain   . Depression   . Diabetes mellitus without complication (HCC)   . Hyperlipidemia   . Hypertension          Past Surgical History:  Procedure Laterality Date  . ACHILLES TENDON REPAIR Right   . GASTRIC BYPASS    . REPLACEMENT TOTAL KNEE Right   . RIGHT/LEFT HEART CATH AND CORONARY ANGIOGRAPHY N/A 01/28/2018   Procedure: RIGHT/LEFT HEART CATH AND CORONARY ANGIOGRAPHY;  Surgeon: Runell GessBerry, Jonathan J, MD;  Location: [facility] INVASIVE CV LAB;  Service: Cardiovascular;  Laterality: N/A;         Family History  Problem Relation Age of Onset  . Depression Mother   . Anxiety disorder Mother   . Hypertension Mother   . Depression Father   . Diabetes Mellitus II Father   . Anxiety disorder Father   . Hypertension Father     SOCIAL HISTORY: Social History        Socioeconomic History  . Marital status: Single    Spouse name: Not on file  . Number of children: Not  on file  . Years of education: Not on file  . Highest education level: Not on file  Occupational History  . Not on file  Social Needs  . Financial resource strain: Not on file  . Food insecurity    Worry: Not on file    Inability: Not on file  . Transportation needs    Medical: Not on file    Non-medical: Not on file  Tobacco Use  . Smoking status: Former Research scientist (life sciences)  . Smokeless tobacco: Never Used  Substance and Sexual Activity  . Alcohol use: No    Frequency: Never  . Drug use: No  . Sexual activity: Not on file  Lifestyle  . Physical activity    Days per week: Not on file    Minutes per session: Not on file  . Stress: Not on file  Relationships  . Social Product manager on phone: Not on file    Gets together: Not on file    Attends religious service: Not on file    Active member of club or organization: Not on file    Attends meetings of clubs or organizations: Not on file    Relationship status: Not on file  . Intimate partner violence    Fear of current or ex partner: Not on file    Emotionally abused: Not on file    Physically abused: Not on file    Forced sexual activity: Not on file  Other Topics Concern  . Not on file  Social History Narrative  . Not on file        Allergies  Allergen Reactions  . Atorvastatin Other (See Comments)  . Bee Venom Swelling          Current Outpatient Medications  Medication Sig Dispense Refill  . aspirin EC 325 MG tablet Take 325 mg by mouth at bedtime.     Marland Kitchen buPROPion (WELLBUTRIN) 100 MG tablet Take 100 mg by mouth.    . gabapentin (NEURONTIN) 300 MG capsule Take 900 mg by mouth at bedtime.     Marland Kitchen lisinopril-hydrochlorothiazide (PRINZIDE,ZESTORETIC) 20-12.5 MG tablet TAKE 1 TABLET by mouth daily    . Multiple Vitamins-Minerals (CENTRUM SILVER 50+MEN PO) Take 1 tablet by mouth daily.    Marland Kitchen omeprazole (PRILOSEC) 40 MG capsule Take 40 mg by mouth 2 (two) times daily.    Marland Kitchen oxyCODONE (ROXICODONE) 15 MG immediate release tablet Take 15 mg by mouth 4 (four) times daily.  0  . pioglitazone (ACTOS) 30 MG tablet Take 30 mg by mouth daily.  3  . pravastatin (PRAVACHOL) 20 MG tablet Take 20 mg by mouth at bedtime.    Marland Kitchen rOPINIRole (REQUIP) 2 MG tablet TAKE 2 mg TWICE DAILY    . sertraline (ZOLOFT) 100 MG tablet Take 100 mg by mouth daily.     No current facility-administered medications for this visit.     REVIEW OF SYSTEMS:  [X]  denotes positive finding, [ ]  denotes negative finding Cardiac  Comments:  Chest pain or chest pressure:    Shortness of breath upon exertion:    Short of breath when lying flat:    Irregular heart rhythm:         Vascular    Pain in calf, thigh, or hip brought on by ambulation: x Right worse than left  Pain in feet at night that wakes you up from your sleep:  x Being treated for restless legs as well  Blood clot in your veins:  Leg swelling:         Pulmonary    Oxygen at home:    Productive cough:     Wheezing:         Neurologic    Sudden weakness in arms or legs:     Sudden numbness in arms or legs:     Sudden onset of difficulty speaking or slurred speech:    Temporary loss of vision in one eye:     Problems with dizziness:         Gastrointestinal    Blood in stool:     Vomited blood:         Genitourinary    Burning when urinating:     Blood in urine:        Psychiatric    Major depression:         Hematologic    Bleeding problems:    Problems with blood clotting too easily:        Skin    Rashes or ulcers:        Constitutional    Fever or chills:      PHYSICAL EXAM:    Vitals:   05/17/19 0955  BP: 131/67  Pulse: 62  Resp: 20  Temp: (!) 97.3 F (36.3 C)  SpO2: 98%  Weight: (!) 328 lb (148.8 kg)  Height: 5\' 7"  (1.702 m)    GENERAL: The patient is a well-nourished male, in no acute distress. The vital signs are documented above. CARDIAC: There is a regular rate and rhythm.  VASCULAR:  2+ radial pulse palpable bilaterally No palpable right femoral pulse Weak left femoral pulse palpable No pedal or popliteal pulses palpable PULMONARY: There is good air exchange bilaterally without wheezing or rales. ABDOMEN: Soft and non-tender with normal pitched bowel sounds. Obese.  Large pannus. MUSCULOSKELETAL: There are no major deformities or cyanosis. NEUROLOGIC: No focal weakness or paresthesias are detected. SKIN: There are no ulcers or rashes noted. PSYCHIATRIC: The patient has a normal affect.  DATA:   I independently reviewed his ABIs which are 0.92 on the right and  monophasic and 1.04 on the left and monophasic severely depressed toe pressures  Assessment/Plan:  68 year old male with multiple medical problems as noted above the presents with progressive lifestyle limiting short distance claudication with concern for some early rest pain symptoms particularly in the right leg.   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  I can appreciate a weak left femoral pulse. Discussed plan for aortogram with bilateral lower extremity arteriogram in the Cath Lab.  Discussed this would likely be diagnostic and given that his right leg is worse probably make plans for right common femoral endarterectomy with retrograde iliac stenting in the OR.  However we will get some updated imaging to ensure he does not need anything further.   Cephus Shellinghristopher J. Angie Piercey, MD [facility] [place] Office: [phone] Pager: [phone]

## 2019-06-08 NOTE — Op Note (Signed)
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 Keyleigh Manninen, MD Procedure Performed: 1.  Ultrasound-guided access of the left common femoral artery 2.  CO2 aortogram 3.  Dedicated bilateral iliac arteriogram with contrast 4.  Bilateral lower extremity arteriogram with CO2 5.  30 minutes of monitored moderate conscious sedation time  Indications: Patient is a 68 year old male with multiple medical problems including morbid obesity who recently presented to clinic with lifestyle limiting short distance claudication of the bilateral lower extremities.  He stated that his right leg was in worse shape than his left leg.  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  He ultimately was [facility] and CTA revealed high-grade right common iliac stenosis as well as high-grade bilateral common femoral disease.  He presents today for updated aortogram bilateral lower extremity arteriogram with CO2 given CKD for operative planning.  Risk and benefits were discussed with the patient.  Findings:   Aortogram showed a high-grade greater than 90% right common iliac stenosis with a large calcific plaque in the right common iliac artery.  The right external iliac was widely patent.  His right common femoral artery has a high-grade greater than 90% stenosis with preserved runoff of both SFA and the profunda.  The left common iliac artery is calcified as well but there does not appear to be flow-limiting stenosis in the left common or external iliac artery.  His left common femoral artery also has a high-grade greater than 90% stenosis.  Bilateral lower extremity runoff with CO2 showed patent  bilateral SFAs as well as popliteal arteries and three-vessel runoff in the bilateral lower extremities.  Given CO2 we were not able to evaluate runoff into the foot.   Procedure:  The patient was identified in the holding area and taken to room 8.  The patient was then placed supine on the table and prepped and draped in the usual sterile fashion.  A time out was called.  Ultrasound was used to evaluate the left common femoral artery.  It was patent, although heavily diseased .  A digital ultrasound image was acquired.  A micropuncture needle was used to access the left common femoral artery under ultrasound guidance.  An 018 wire was advanced without resistance and a micropuncture sheath was placed.  The 018 wire was removed and a benson wire was placed.  The micropuncture sheath was exchanged for a 5 french sheath.  The patient was given 5,000 units of IV heparin.  An omniflush catheter was advanced over the wire to the level of L-1.  An abdominal angiogram was obtained with CO2.  Next, the Omni Flush catheter was pulled down to the air bifurcation and bilateral iliac shots were obtained with CO2.  Given overlying bowel gas with poor visibility we did decide to use 15 cc of contrast to get dedicated iliac imaging with pertinent findings noted above.  We then left the catheter at the aortic bifurcation and bilateral runoff was obtained with CO2 given his renal dysfunction.  Pertinent findings are noted above.  Ultimately given he will need consideration for open surgery wires and catheters were removed.  I did not feel that we could mynx the left groin sheath given the  amount common femoral disease that he had.  His left common femoral sheath was then pulled and manual pressure was held for 20 minutes.  Plan: Patient will be considered for a right common femoral endarterectomy with retrograde right common iliac artery stent.  If unsuccessful will potentially need a left to right femoral-femoral bypass with  left common femoral endarterectomy as well.  Will get cardiology clearance given moderate aortic stenosis, etc.   Marty Heck, MD [facility] Office: [phone] Pager: [place]

## 2019-06-09 ENCOUNTER — Encounter (HOSPITAL_COMMUNITY): Payer: Self-pay | Admitting: Vascular Surgery

## 2019-06-10 ENCOUNTER — Ambulatory Visit: Payer: Medicare HMO | Admitting: Cardiovascular Disease

## 2019-06-14 ENCOUNTER — Ambulatory Visit: Payer: Medicare HMO | Admitting: Adult Health

## 2019-06-14 ENCOUNTER — Other Ambulatory Visit: Payer: Self-pay

## 2019-06-14 ENCOUNTER — Ambulatory Visit (INDEPENDENT_AMBULATORY_CARE_PROVIDER_SITE_OTHER): Payer: Medicare HMO | Admitting: General Practice

## 2019-06-14 ENCOUNTER — Encounter: Payer: Self-pay | Admitting: Physician Assistant

## 2019-06-14 VITALS — BP 118/58 | HR 72 | Temp 97.1°F | Ht 68.0 in | Wt 322.8 lb

## 2019-06-14 DIAGNOSIS — I350 Nonrheumatic aortic (valve) stenosis: Secondary | ICD-10-CM

## 2019-06-14 DIAGNOSIS — Z01818 Encounter for other preprocedural examination: Secondary | ICD-10-CM | POA: Diagnosis not present

## 2019-06-14 DIAGNOSIS — E7800 Pure hypercholesterolemia, unspecified: Secondary | ICD-10-CM

## 2019-06-14 DIAGNOSIS — I10 Essential (primary) hypertension: Secondary | ICD-10-CM

## 2019-06-14 NOTE — Progress Notes (Signed)
Cardiology Clinic Note   Patient Name: Matthew AntuJoe Duarte Date of Encounter: 06/14/2019  Primary Care Provider:  Bernerd LimboMitchell, John Primary Cardiologist:  Nanetta BattyJonathan Berry, MD  Patient Profile    Matthew AntuJoe Duarte 68 year old male presents today for cardiac surgical clearance.  Past Medical History    Past Medical History:  Diagnosis Date  . Anemia   . Chronic back pain   . Depression   . Diabetes mellitus without complication (HCC)   . Hyperlipidemia   . Hypertension    Past Surgical History:  Procedure Laterality Date  . ABDOMINAL AORTOGRAM W/LOWER EXTREMITY N/A 06/08/2019   Procedure: ABDOMINAL AORTOGRAM W/LOWER EXTREMITY;  Surgeon: Cephus Shellinglark, Christopher J, MD;  Location: [facility] INVASIVE CV LAB;  Service: Cardiovascular;  Laterality: N/A;  . ACHILLES TENDON REPAIR Right   . GASTRIC BYPASS    . REPLACEMENT TOTAL KNEE Right   . RIGHT/LEFT HEART CATH AND CORONARY ANGIOGRAPHY N/A 01/28/2018   Procedure: RIGHT/LEFT HEART CATH AND CORONARY ANGIOGRAPHY;  Surgeon: Runell GessBerry, Jonathan J, MD;  Location: [facility] INVASIVE CV LAB;  Service: Cardiovascular;  Laterality: N/A;    Allergies  Allergies  Allergen Reactions  . Atorvastatin Other (See Comments)  . Bee Venom Swelling    History of Present Illness  [name] was last seen by Dr. Allyson SabalBerry on 01/28/2018.  During that time he was experiencing exertional chest pressure and dyspnea on exertion.  He underwent cardiac catheterization and was found to have a 40% stenosed circumflex lesion and a 50% stenosed mid RCA lesion.  His LVEDP was elevated at 26 ,peak aortic gradient was 20.  Mild elevated filling pressures with mean wedge of 20 and V wave of 28.  Medical management was recommended.  His 2D echocardiogram from 01/12/2018 showed normal LV function, mild concentric LVH with moderate aortic stenosis, and valve area of 1.127 m with a peak gradient of 44 mmHg.  His PMH also includes essential hypertension, PVD, hyperlipidemia, and morbid obesity.  He presents to the  clinic today for surgical cardiac clearance.  He states he is able to complete his grocery shopping but takes about 1 hour pushing a cart.  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   He plans to have endarterectomy of his right common femoral artery with stent placement and femoral to femoral grafting by Dr. Sherald Hesshristopher Clark.  He denies chest pain, increased shortness of breath, lower extremity edema, fatigue, palpitations, melena, hematuria, hemoptysis, diaphoresis, weakness, presyncope, syncope, orthopnea, and PND.  Home Medications    Prior to Admission medications   Medication Sig Start Date End Date Taking? Authorizing Provider  aspirin EC 325 MG tablet Take 325 mg by mouth at bedtime.     [provider]  buPROPion (WELLBUTRIN) 100 MG tablet Take 100 mg by mouth.    [provider]  gabapentin (NEURONTIN) 300 MG capsule Take 900 mg by mouth at bedtime.     [provider]  lisinopril-hydrochlorothiazide (PRINZIDE,ZESTORETIC) 20-12.5 MG tablet TAKE 1 TABLET by mouth daily 01/28/16   [provider]  Multiple Vitamins-Minerals (CENTRUM SILVER 50+MEN PO) Take 1 tablet by mouth daily.    [provider]  omeprazole (PRILOSEC) 40 MG capsule Take 40 mg by mouth 2 (two) times daily.    [provider]  oxyCODONE (ROXICODONE) 15 MG immediate release tablet Take 15 mg by mouth 4 (four) times daily. 12/25/17   [provider]  pioglitazone (ACTOS) 30 MG tablet Take 30  mg by mouth daily. 01/18/18   [provider]  pravastatin (PRAVACHOL) 20 MG tablet Take 20 mg by mouth at bedtime.    [provider]  rOPINIRole (REQUIP) 2 MG tablet TAKE 2 mg TWICE DAILY 12/18/15   [provider]  sertraline (ZOLOFT) 100 MG tablet Take 100 mg by mouth daily.    [provider]    Family History    Family History  Problem Relation Age of Onset   . Depression Mother   . Anxiety disorder Mother   . Hypertension Mother   . Depression Father   . Diabetes Mellitus II Father   . Anxiety disorder Father   . Hypertension Father    He indicated that the status of his mother is unknown. He indicated that the status of his father is unknown.  Social History    Social History   Socioeconomic History  . Marital status: Single    Spouse name: Not on file  . Number of children: Not on file  . Years of education: Not on file  . Highest education level: Not on file  Occupational History  . Not on file  Social Needs  . Financial resource strain: Not on file  . Food insecurity    Worry: Not on file    Inability: Not on file  . Transportation needs    Medical: Not on file    Non-medical: Not on file  Tobacco Use  . Smoking status: Former Games developermoker  . Smokeless tobacco: Never Used  Substance and Sexual Activity  . Alcohol use: No    Frequency: Never  . Drug use: No  . Sexual activity: Not on file  Lifestyle  . Physical activity    Days per week: Not on file    Minutes per session: Not on file  . Stress: Not on file  Relationships  . Social Musicianconnections    Talks on phone: Not on file    Gets together: Not on file    Attends religious service: Not on file    Active member of club or organization: Not on file    Attends meetings of clubs or organizations: Not on file    Relationship status: Not on file  . Intimate partner violence    Fear of current or ex partner: Not on file    Emotionally abused: Not on file    Physically abused: Not on file    Forced sexual activity: Not on file  Other Topics Concern  . Not on file  Social History Narrative  . Not on file     Review of Systems    General:  No chills, fever, night sweats or weight changes.  Cardiovascular:  No chest pain, dyspnea on exertion, edema, orthopnea, palpitations, paroxysmal nocturnal dyspnea. Dermatological: No rash, lesions/masses Respiratory: No cough,  dyspnea Urologic: No hematuria, dysuria Abdominal:   No nausea, vomiting, diarrhea, bright red blood per rectum, melena, or hematemesis Neurologic:  No visual changes, wkns, changes in mental status. All other systems reviewed and are otherwise negative except as noted above.  Physical Exam    VS:  BP (!) 118/58 (BP Location: Right Arm, Patient Position: Sitting, Cuff Size: Large)   Pulse 72   Temp (!) 97.1 F (36.2 C)   Ht 5\' 8"  (1.727 m)   Wt (!) 322 lb 12.8 oz (146.4 kg)   SpO2 96%   BMI 49.08 kg/m  , BMI Body mass index is 49.08 kg/m. GEN: Well nourished, well developed,  in no acute distress. HEENT: normal. Neck: Supple, no JVD, carotid bruits, or masses. Cardiac: RRR, 2/6 systolic ejection murmurs, rubs, or gallops. No clubbing, cyanosis, edema.  Radials/DP/PT 2+ and equal bilaterally.  Respiratory:  Respirations regular and unlabored, clear to auscultation bilaterally. GI: Soft, nontender, nondistended, BS + x 4. MS: no deformity or atrophy. Skin: warm and dry, no rash. Neuro:  Strength and sensation are intact. Psych: Normal affect.  Accessory Clinical Findings    ECG personally reviewed by me today-none today EKG 06/08/2019: Normal sinus rhythm right bundle branch block 69 bpm no acute changes  EKG 01/21/2018: Normal sinus rhythm right bundle branch block 82 bpm  Echocardiogram 01/12/2018: normal LV function, mild concentric LVH with moderate aortic stenosis. His valve area was 1.127 m with a peak gradient of 44 mmHg.  Cardiac catheterization 01/28/2018  Mid RCA lesion is 50% stenosed.  Ost Cx to Prox Cx lesion is 40% stenosed. [name] has mild CAD with no physiologically significant lesions. His LVEDP was mildly elevated at 26. His peak aortic gradient was 20 with a valve area of approximately 2 cm. He had mildly elevated filling pressures with a mean wedge of 20 and a V wave of 28. At this point, I do not think he has significant aortic stenosis or coronary  artery disease. I recommend medical therapy.   Assessment & Plan   1.  Surgical cardiac evaluation-no further cardiac tests needed  Chart reviewed as part of pre-operative protocol coverage. Given past medical history and time since last visit, based on ACC/AHA guidelines, [name] would be at acceptable risk for the planned procedure without further cardiovascular testing.   I will route this recommendation to the requesting party via Epic fax function and remove from pre-op pool.  2.  Aortic stenosis- 2/6 systolic ejection murmur on auscultation.  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  No increased activity intolerance. Recommend repeat echocardiogram 2021  3.  Essential hypertension-well controlled today 118/58 Continue lisinopril-hydrochlorothiazide 20-12.5 mg tablet daily Increase physical activity as tolerated Heart healthy low-sodium diet  4.  Pure hypercholesterolemia-LDL 51 12/17/2018 Pravastatin 20 mg tablet at bedtime Monitored by PCP  5.  Morbid obesity- 5 pound weight loss from 05/17/2019 Heart healthy low-sodium diet  increase physical activity as tolerated       Please call with questions.  Deberah Pelton, NP 06/14/2019, 10:25 AM

## 2019-06-14 NOTE — Patient Instructions (Signed)
Medication Instructions:  The current medical regimen is effective;  continue present plan and medications.  If you need a refill on your cardiac medications before your next appointment, please call your pharmacy.   Follow-Up: At [facility], you and your health needs are our priority.  As part of our continuing mission to provide you with exceptional heart care, we have created designated Provider Care Teams.  These Care Teams include your primary Cardiologist (physician) and Advanced Practice Providers (APPs -  Physician Assistants and Nurse Practitioners) who all work together to provide you with the care you need, when you need it. You will need a follow up appointment in 12 months.  Please call our office 2 months in advance to schedule this appointment.  You may see Quay Burow, MD or one of the following Advanced Practice Providers on your designated Care Team:   Kerin Ransom, PA-C Roby Lofts, [place] . Sande Rives, PA-C

## 2019-06-16 ENCOUNTER — Other Ambulatory Visit (HOSPITAL_COMMUNITY)
Admission: RE | Admit: 2019-06-16 | Discharge: 2019-06-16 | Disposition: A | Discharge status: Home / Self Care | Payer: Medicare HMO | Source: Ambulatory Visit | Attending: Vascular Surgery | Admitting: Vascular Surgery

## 2019-06-16 DIAGNOSIS — Z01812 Encounter for preprocedural laboratory examination: Secondary | ICD-10-CM | POA: Diagnosis present

## 2019-06-16 DIAGNOSIS — Z20828 Contact with and (suspected) exposure to other viral communicable diseases: Secondary | ICD-10-CM | POA: Insufficient documentation

## 2019-06-16 DIAGNOSIS — I739 Peripheral vascular disease, unspecified: Secondary | ICD-10-CM | POA: Diagnosis not present

## 2019-06-17 ENCOUNTER — Other Ambulatory Visit: Payer: Self-pay

## 2019-06-17 ENCOUNTER — Encounter (HOSPITAL_COMMUNITY): Payer: Self-pay | Admitting: *Deleted

## 2019-06-17 LAB — SARS CORONAVIRUS 2 (TAT 6-24 HRS): SARS Coronavirus 2: NEGATIVE

## 2019-06-17 MED ORDER — DEXTROSE 5 % IV SOLN
3.0000 g | INTRAVENOUS | Status: AC
  Administered 2019-06-20: 08:00:00 3 g via INTRAVENOUS
  Filled 2019-06-17: qty 3

## 2019-06-17 NOTE — Progress Notes (Signed)
Spoke with pt for pre-op call. Pt has hx of CAD, Dr. Gwenlyn Found is his cardiologist. He denies any recent chest pain or sob. Pt is a type 2 diabetic. He doesn't remember what his last A1C was. I called [employer] and they state he hasn't had one in the past year. Pt states his fasting blood sugar is usually between 120-150.   Pt had his Covid test done yesterday and it was negative.  Pt states he is in quarantine.   Coronavirus Screening  Have you experienced the following symptoms:  Cough NO Fever (>100.71F)  NO Runny nose NO Sore throat NO Difficulty breathing/shortness of breath  NO  Have you or a family member traveled in the last 14 days and where? NO  Patient reminded that hospital visitation restrictions are in effect and the importance of the restrictions. Informed pt that he could have 1 visitor sit in the waiting room while he is in pre-op, surgery and PACU. Once he's admitted his 1 visitor is allowed to visit to visiting hours. Pt voiced understanding.

## 2019-06-20 ENCOUNTER — Encounter (HOSPITAL_COMMUNITY): Payer: Self-pay | Admitting: *Deleted

## 2019-06-20 ENCOUNTER — Other Ambulatory Visit: Payer: Self-pay

## 2019-06-20 ENCOUNTER — Inpatient Hospital Stay (HOSPITAL_COMMUNITY)
Admission: RE | Admit: 2019-06-20 | Discharge: 2019-06-21 | DRG: 254 | Disposition: A | Discharge status: Home / Self Care | Payer: Medicare HMO | Attending: Vascular Surgery | Admitting: Vascular Surgery

## 2019-06-20 ENCOUNTER — Inpatient Hospital Stay (HOSPITAL_COMMUNITY): Payer: Medicare HMO

## 2019-06-20 ENCOUNTER — Encounter (HOSPITAL_COMMUNITY)
Admission: RE | Disposition: A | Discharge status: Home / Self Care | Payer: Self-pay | Source: Home / Self Care | Attending: Vascular Surgery

## 2019-06-20 DIAGNOSIS — Z818 Family history of other mental and behavioral disorders: Secondary | ICD-10-CM | POA: Diagnosis not present

## 2019-06-20 DIAGNOSIS — M549 Dorsalgia, unspecified: Secondary | ICD-10-CM | POA: Diagnosis present

## 2019-06-20 DIAGNOSIS — Z87891 Personal history of nicotine dependence: Secondary | ICD-10-CM | POA: Diagnosis not present

## 2019-06-20 DIAGNOSIS — Z8249 Family history of ischemic heart disease and other diseases of the circulatory system: Secondary | ICD-10-CM | POA: Diagnosis not present

## 2019-06-20 DIAGNOSIS — E785 Hyperlipidemia, unspecified: Secondary | ICD-10-CM | POA: Diagnosis present

## 2019-06-20 DIAGNOSIS — Z7982 Long term (current) use of aspirin: Secondary | ICD-10-CM | POA: Diagnosis not present

## 2019-06-20 DIAGNOSIS — I739 Peripheral vascular disease, unspecified: Secondary | ICD-10-CM | POA: Diagnosis present

## 2019-06-20 DIAGNOSIS — I70213 Atherosclerosis of native arteries of extremities with intermittent claudication, bilateral legs: Secondary | ICD-10-CM | POA: Diagnosis present

## 2019-06-20 DIAGNOSIS — Z9884 Bariatric surgery status: Secondary | ICD-10-CM | POA: Diagnosis not present

## 2019-06-20 DIAGNOSIS — Z888 Allergy status to other drugs, medicaments and biological substances status: Secondary | ICD-10-CM

## 2019-06-20 DIAGNOSIS — K219 Gastro-esophageal reflux disease without esophagitis: Secondary | ICD-10-CM | POA: Diagnosis present

## 2019-06-20 DIAGNOSIS — Z91030 Bee allergy status: Secondary | ICD-10-CM | POA: Diagnosis not present

## 2019-06-20 DIAGNOSIS — G8929 Other chronic pain: Secondary | ICD-10-CM | POA: Diagnosis present

## 2019-06-20 DIAGNOSIS — Z833 Family history of diabetes mellitus: Secondary | ICD-10-CM

## 2019-06-20 DIAGNOSIS — I10 Essential (primary) hypertension: Secondary | ICD-10-CM | POA: Diagnosis present

## 2019-06-20 DIAGNOSIS — Z96651 Presence of right artificial knee joint: Secondary | ICD-10-CM | POA: Diagnosis present

## 2019-06-20 DIAGNOSIS — I70211 Atherosclerosis of native arteries of extremities with intermittent claudication, right leg: Secondary | ICD-10-CM | POA: Diagnosis not present

## 2019-06-20 DIAGNOSIS — Z7901 Long term (current) use of anticoagulants: Secondary | ICD-10-CM | POA: Diagnosis not present

## 2019-06-20 DIAGNOSIS — Z79899 Other long term (current) drug therapy: Secondary | ICD-10-CM | POA: Diagnosis not present

## 2019-06-20 DIAGNOSIS — F329 Major depressive disorder, single episode, unspecified: Secondary | ICD-10-CM | POA: Diagnosis present

## 2019-06-20 DIAGNOSIS — G2581 Restless legs syndrome: Secondary | ICD-10-CM | POA: Diagnosis present

## 2019-06-20 DIAGNOSIS — E1151 Type 2 diabetes mellitus with diabetic peripheral angiopathy without gangrene: Principal | ICD-10-CM | POA: Diagnosis present

## 2019-06-20 HISTORY — DX: Peripheral vascular disease, unspecified: I73.9

## 2019-06-20 HISTORY — DX: Unspecified asthma, uncomplicated: J45.909

## 2019-06-20 HISTORY — PX: ENDARTERECTOMY FEMORAL: SHX5804

## 2019-06-20 HISTORY — PX: INSERTION OF ILIAC STENT: SHX6256

## 2019-06-20 HISTORY — DX: Sleep apnea, unspecified: G47.30

## 2019-06-20 HISTORY — PX: PATCH ANGIOPLASTY: SHX6230

## 2019-06-20 HISTORY — DX: Gastro-esophageal reflux disease without esophagitis: K21.9

## 2019-06-20 HISTORY — DX: Chronic kidney disease, unspecified: N18.9

## 2019-06-20 LAB — COMPREHENSIVE METABOLIC PANEL
ALT: 18 U/L (ref 0–44)
AST: 19 U/L (ref 15–41)
Albumin: 3.9 g/dL (ref 3.5–5.0)
Alkaline Phosphatase: 57 U/L (ref 38–126)
Anion gap: 10 (ref 5–15)
BUN: 27 mg/dL — ABNORMAL HIGH (ref 8–23)
CO2: 26 mmol/L (ref 22–32)
Calcium: 8.9 mg/dL (ref 8.9–10.3)
Chloride: 99 mmol/L (ref 98–111)
Creatinine, Ser: 1.9 mg/dL — ABNORMAL HIGH (ref 0.61–1.24)
GFR calc Af Amer: 42 mL/min — ABNORMAL LOW (ref >60)
GFR calc non Af Amer: 36 mL/min — ABNORMAL LOW (ref >60)
Glucose, Bld: 138 mg/dL — ABNORMAL HIGH (ref 70–99)
Potassium: 4 mmol/L (ref 3.5–5.1)
Sodium: 135 mmol/L (ref 135–145)
Total Bilirubin: 0.4 mg/dL (ref 0.3–1.2)
Total Protein: 6.8 g/dL (ref 6.5–8.1)

## 2019-06-20 LAB — BLOOD GAS, ARTERIAL
Acid-Base Excess: 2.6 mmol/L — ABNORMAL HIGH (ref 0.0–2.0)
Bicarbonate: 27.1 mmol/L (ref 20.0–28.0)
Drawn by: 470590
FIO2: 21
O2 Saturation: 97.3 %
Patient temperature: 98.6
pCO2 arterial: 45.1 mmHg (ref 32.0–48.0)
pH, Arterial: 7.396 (ref 7.350–7.450)
pO2, Arterial: 101 mmHg (ref 83.0–108.0)

## 2019-06-20 LAB — GLUCOSE, CAPILLARY
Glucose-Capillary: 115 mg/dL — ABNORMAL HIGH (ref 70–99)
Glucose-Capillary: 165 mg/dL — ABNORMAL HIGH (ref 70–99)
Glucose-Capillary: 231 mg/dL — ABNORMAL HIGH (ref 70–99)
Glucose-Capillary: 214 mg/dL — ABNORMAL HIGH (ref 70–99)

## 2019-06-20 LAB — HEMOGLOBIN A1C
Hgb A1c MFr Bld: 7.7 % — ABNORMAL HIGH (ref 4.8–5.6)
Mean Plasma Glucose: 174.29 mg/dL

## 2019-06-20 LAB — TYPE AND SCREEN
ABO/RH(D): A POS
Antibody Screen: NEGATIVE

## 2019-06-20 LAB — CBC
HCT: 31.6 % — ABNORMAL LOW (ref 39.0–52.0)
Hemoglobin: 10.1 g/dL — ABNORMAL LOW (ref 13.0–17.0)
MCH: 27.6 pg (ref 26.0–34.0)
MCHC: 32 g/dL (ref 30.0–36.0)
MCV: 86.3 fL (ref 80.0–100.0)
Platelets: 244 10*3/uL (ref 150–400)
RBC: 3.66 MIL/uL — ABNORMAL LOW (ref 4.22–5.81)
RDW: 14.4 % (ref 11.5–15.5)
WBC: 8 10*3/uL (ref 4.0–10.5)
nRBC: 0 % (ref 0.0–0.2)

## 2019-06-20 LAB — URINALYSIS, ROUTINE W REFLEX MICROSCOPIC
Bilirubin Urine: NEGATIVE
Glucose, UA: NEGATIVE mg/dL
Hgb urine dipstick: NEGATIVE
Ketones, ur: NEGATIVE mg/dL
Leukocytes,Ua: NEGATIVE
Nitrite: NEGATIVE
Protein, ur: NEGATIVE mg/dL
Specific Gravity, Urine: 1.013 (ref 1.005–1.030)
pH: 6 (ref 5.0–8.0)

## 2019-06-20 LAB — ABO/RH: ABO/RH(D): A POS

## 2019-06-20 LAB — POCT I-STAT 4, (NA,K, GLUC, HGB,HCT)
Glucose, Bld: 175 mg/dL — ABNORMAL HIGH (ref 70–99)
HCT: 27 % — ABNORMAL LOW (ref 39.0–52.0)
Hemoglobin: 9.2 g/dL — ABNORMAL LOW (ref 13.0–17.0)
Potassium: 4.5 mmol/L (ref 3.5–5.1)
Sodium: 134 mmol/L — ABNORMAL LOW (ref 135–145)

## 2019-06-20 LAB — SURGICAL PCR SCREEN
MRSA, PCR: NEGATIVE
Staphylococcus aureus: NEGATIVE

## 2019-06-20 LAB — POCT ACTIVATED CLOTTING TIME
Activated Clotting Time: 246 seconds
Activated Clotting Time: 224 seconds
Activated Clotting Time: 246 seconds
Activated Clotting Time: 147 seconds

## 2019-06-20 LAB — APTT: aPTT: 35 seconds (ref 24–36)

## 2019-06-20 LAB — PROTIME-INR
INR: 1.1 (ref 0.8–1.2)
Prothrombin Time: 14.5 seconds (ref 11.4–15.2)

## 2019-06-20 SURGERY — ENDARTERECTOMY, FEMORAL
Anesthesia: General | Site: Groin | Laterality: Right

## 2019-06-20 MED ORDER — INSULIN ASPART 100 UNIT/ML ~~LOC~~ SOLN
0.0000 [IU] | Freq: Three times a day (TID) | SUBCUTANEOUS | Status: DC
  Administered 2019-06-21 (×2): 2 [IU] via SUBCUTANEOUS

## 2019-06-20 MED ORDER — LABETALOL HCL 5 MG/ML IV SOLN: 10.0000 mg | INTRAVENOUS | Status: DC | PRN

## 2019-06-20 MED ORDER — LIDOCAINE 2% (20 MG/ML) 5 ML SYRINGE
INTRAMUSCULAR | Status: AC
  Filled 2019-06-20: qty 5

## 2019-06-20 MED ORDER — 0.9 % SODIUM CHLORIDE (POUR BTL) OPTIME
TOPICAL | Status: DC | PRN
  Administered 2019-06-20: 2000 mL

## 2019-06-20 MED ORDER — ONDANSETRON HCL 4 MG/2ML IJ SOLN: 4.0000 mg | Freq: Four times a day (QID) | INTRAMUSCULAR | Status: DC | PRN

## 2019-06-20 MED ORDER — SUCCINYLCHOLINE CHLORIDE 200 MG/10ML IV SOSY
PREFILLED_SYRINGE | INTRAVENOUS | Status: AC
  Filled 2019-06-20: qty 10

## 2019-06-20 MED ORDER — POTASSIUM CHLORIDE CRYS ER 20 MEQ PO TBCR: 20.0000 meq | EXTENDED_RELEASE_TABLET | Freq: Every day | ORAL | Status: DC | PRN

## 2019-06-20 MED ORDER — LACTATED RINGERS IV SOLN
INTRAVENOUS | Status: DC | PRN
  Administered 2019-06-20: 08:00:00 via INTRAVENOUS

## 2019-06-20 MED ORDER — LACTATED RINGERS IV SOLN
INTRAVENOUS | Status: DC | PRN
  Administered 2019-06-20: 07:00:00 via INTRAVENOUS

## 2019-06-20 MED ORDER — CEFAZOLIN SODIUM-DEXTROSE 2-4 GM/100ML-% IV SOLN
2.0000 g | Freq: Three times a day (TID) | INTRAVENOUS | Status: AC
  Administered 2019-06-20 – 2019-06-21 (×2): 2 g via INTRAVENOUS
  Filled 2019-06-20 (×2): qty 100

## 2019-06-20 MED ORDER — LABETALOL HCL 5 MG/ML IV SOLN
INTRAVENOUS | Status: DC | PRN
  Administered 2019-06-20 (×4): 5 mg via INTRAVENOUS

## 2019-06-20 MED ORDER — LISINOPRIL 10 MG PO TABS
20.0000 mg | ORAL_TABLET | Freq: Every day | ORAL | Status: DC
  Administered 2019-06-20 – 2019-06-21 (×2): 20 mg via ORAL
  Filled 2019-06-20 (×2): qty 2

## 2019-06-20 MED ORDER — LIDOCAINE 2% (20 MG/ML) 5 ML SYRINGE
INTRAMUSCULAR | Status: DC | PRN
  Administered 2019-06-20: 80 mg via INTRAVENOUS

## 2019-06-20 MED ORDER — SODIUM CHLORIDE 0.9 % IV SOLN: 500.0000 mL | Freq: Once | INTRAVENOUS | Status: DC | PRN

## 2019-06-20 MED ORDER — SODIUM CHLORIDE 0.9 % IV SOLN
INTRAVENOUS | Status: DC | PRN
  Administered 2019-06-20: 09:00:00 500 mL

## 2019-06-20 MED ORDER — POLYETHYLENE GLYCOL 3350 17 G PO PACK: 17.0000 g | PACK | Freq: Every day | ORAL | Status: DC | PRN

## 2019-06-20 MED ORDER — ONDANSETRON HCL 4 MG/2ML IJ SOLN
INTRAMUSCULAR | Status: AC
  Filled 2019-06-20: qty 2

## 2019-06-20 MED ORDER — ROCURONIUM BROMIDE 10 MG/ML (PF) SYRINGE
PREFILLED_SYRINGE | INTRAVENOUS | Status: AC
  Filled 2019-06-20: qty 10

## 2019-06-20 MED ORDER — GABAPENTIN 300 MG PO CAPS
900.0000 mg | ORAL_CAPSULE | Freq: Every day | ORAL | Status: DC
  Administered 2019-06-20: 900 mg via ORAL
  Filled 2019-06-20: qty 3

## 2019-06-20 MED ORDER — HYDRALAZINE HCL 20 MG/ML IJ SOLN: 5.0000 mg | INTRAMUSCULAR | Status: DC | PRN

## 2019-06-20 MED ORDER — MUPIROCIN 2 % EX OINT
1.0000 "application " | TOPICAL_OINTMENT | Freq: Once | CUTANEOUS | Status: DC
  Filled 2019-06-20: qty 22

## 2019-06-20 MED ORDER — CHLORHEXIDINE GLUCONATE CLOTH 2 % EX PADS: 6.0000 | MEDICATED_PAD | Freq: Once | CUTANEOUS | Status: DC

## 2019-06-20 MED ORDER — METOPROLOL TARTRATE 5 MG/5ML IV SOLN: 2.0000 mg | INTRAVENOUS | Status: DC | PRN

## 2019-06-20 MED ORDER — HEPARIN SODIUM (PORCINE) 1000 UNIT/ML IJ SOLN
INTRAMUSCULAR | Status: AC
  Filled 2019-06-20: qty 1

## 2019-06-20 MED ORDER — PROPOFOL 10 MG/ML IV BOLUS
INTRAVENOUS | Status: AC
  Filled 2019-06-20: qty 20

## 2019-06-20 MED ORDER — DOCUSATE SODIUM 100 MG PO CAPS
100.0000 mg | ORAL_CAPSULE | Freq: Every day | ORAL | Status: DC
  Administered 2019-06-21: 100 mg via ORAL
  Filled 2019-06-20: qty 1

## 2019-06-20 MED ORDER — OXYCODONE HCL 5 MG PO TABS
15.0000 mg | ORAL_TABLET | Freq: Four times a day (QID) | ORAL | Status: DC | PRN
  Administered 2019-06-20: 15 mg via ORAL
  Filled 2019-06-20: qty 3

## 2019-06-20 MED ORDER — PHENYLEPHRINE 40 MCG/ML (10ML) SYRINGE FOR IV PUSH (FOR BLOOD PRESSURE SUPPORT)
PREFILLED_SYRINGE | INTRAVENOUS | Status: DC | PRN
  Administered 2019-06-20: 80 ug via INTRAVENOUS
  Administered 2019-06-20: 160 ug via INTRAVENOUS
  Administered 2019-06-20 (×2): 80 ug via INTRAVENOUS

## 2019-06-20 MED ORDER — GLYCOPYRROLATE PF 0.2 MG/ML IJ SOSY
PREFILLED_SYRINGE | INTRAMUSCULAR | Status: AC
  Filled 2019-06-20: qty 1

## 2019-06-20 MED ORDER — PRAVASTATIN SODIUM 10 MG PO TABS
20.0000 mg | ORAL_TABLET | Freq: Every day | ORAL | Status: DC
  Administered 2019-06-20: 20 mg via ORAL
  Filled 2019-06-20: qty 2

## 2019-06-20 MED ORDER — ROCURONIUM BROMIDE 10 MG/ML (PF) SYRINGE
PREFILLED_SYRINGE | INTRAVENOUS | Status: DC | PRN
  Administered 2019-06-20: 20 mg via INTRAVENOUS
  Administered 2019-06-20: 100 mg via INTRAVENOUS
  Administered 2019-06-20: 30 mg via INTRAVENOUS

## 2019-06-20 MED ORDER — SODIUM CHLORIDE 0.9 % IV SOLN
INTRAVENOUS | Status: AC
  Filled 2019-06-20: qty 1.2

## 2019-06-20 MED ORDER — SERTRALINE HCL 100 MG PO TABS
100.0000 mg | ORAL_TABLET | Freq: Every day | ORAL | Status: DC
  Administered 2019-06-20 – 2019-06-21 (×2): 100 mg via ORAL
  Filled 2019-06-20 (×2): qty 1

## 2019-06-20 MED ORDER — HYDROCHLOROTHIAZIDE 12.5 MG PO CAPS
12.5000 mg | ORAL_CAPSULE | Freq: Every day | ORAL | Status: DC
  Administered 2019-06-20 – 2019-06-21 (×2): 12.5 mg via ORAL
  Filled 2019-06-20 (×2): qty 1

## 2019-06-20 MED ORDER — SUGAMMADEX SODIUM 200 MG/2ML IV SOLN
INTRAVENOUS | Status: DC | PRN
  Administered 2019-06-20: 290.4 mg via INTRAVENOUS

## 2019-06-20 MED ORDER — SODIUM CHLORIDE 0.9 % IV SOLN
INTRAVENOUS | Status: DC
  Administered 2019-06-20 – 2019-06-21 (×2): via INTRAVENOUS

## 2019-06-20 MED ORDER — MAGNESIUM SULFATE 2 GM/50ML IV SOLN: 2.0000 g | Freq: Every day | INTRAVENOUS | Status: DC | PRN

## 2019-06-20 MED ORDER — FENTANYL CITRATE (PF) 250 MCG/5ML IJ SOLN
INTRAMUSCULAR | Status: AC
  Filled 2019-06-20: qty 5

## 2019-06-20 MED ORDER — FENTANYL CITRATE (PF) 250 MCG/5ML IJ SOLN
INTRAMUSCULAR | Status: DC | PRN
  Administered 2019-06-20 (×2): 50 ug via INTRAVENOUS
  Administered 2019-06-20 (×2): 25 ug via INTRAVENOUS

## 2019-06-20 MED ORDER — PHENOL 1.4 % MT LIQD: 1.0000 | OROMUCOSAL | Status: DC | PRN

## 2019-06-20 MED ORDER — PROTAMINE SULFATE 10 MG/ML IV SOLN
INTRAVENOUS | Status: DC | PRN
  Administered 2019-06-20: 40 mg via INTRAVENOUS
  Administered 2019-06-20: 10 mg via INTRAVENOUS

## 2019-06-20 MED ORDER — DEXAMETHASONE SODIUM PHOSPHATE 10 MG/ML IJ SOLN
INTRAMUSCULAR | Status: AC
  Filled 2019-06-20: qty 1

## 2019-06-20 MED ORDER — HEMOSTATIC AGENTS (NO CHARGE) OPTIME
TOPICAL | Status: DC | PRN
  Administered 2019-06-20 (×2): 1 via TOPICAL

## 2019-06-20 MED ORDER — HEPARIN SODIUM (PORCINE) 1000 UNIT/ML IJ SOLN
INTRAMUSCULAR | Status: DC | PRN
  Administered 2019-06-20: 3000 [IU] via INTRAVENOUS
  Administered 2019-06-20: 14000 [IU] via INTRAVENOUS

## 2019-06-20 MED ORDER — SUGAMMADEX SODIUM 500 MG/5ML IV SOLN
INTRAVENOUS | Status: AC
  Filled 2019-06-20: qty 5

## 2019-06-20 MED ORDER — FENTANYL CITRATE (PF) 100 MCG/2ML IJ SOLN
INTRAMUSCULAR | Status: AC
  Administered 2019-06-20: 13:00:00 50 ug via INTRAVENOUS
  Filled 2019-06-20: qty 2

## 2019-06-20 MED ORDER — EPHEDRINE 5 MG/ML INJ
INTRAVENOUS | Status: AC
  Filled 2019-06-20: qty 10

## 2019-06-20 MED ORDER — BUPROPION HCL 100 MG PO TABS
100.0000 mg | ORAL_TABLET | Freq: Every day | ORAL | Status: DC
  Administered 2019-06-20: 100 mg via ORAL
  Filled 2019-06-20 (×2): qty 1

## 2019-06-20 MED ORDER — LISINOPRIL-HYDROCHLOROTHIAZIDE 20-12.5 MG PO TABS: 1.0000 | ORAL_TABLET | Freq: Every day | ORAL | Status: DC

## 2019-06-20 MED ORDER — SODIUM CHLORIDE 0.9 % IV SOLN
INTRAVENOUS | Status: DC | PRN
  Administered 2019-06-20: 25 ug/min via INTRAVENOUS

## 2019-06-20 MED ORDER — FENTANYL CITRATE (PF) 100 MCG/2ML IJ SOLN: 25.0000 ug | INTRAMUSCULAR | Status: DC | PRN

## 2019-06-20 MED ORDER — MIDAZOLAM HCL 5 MG/5ML IJ SOLN
INTRAMUSCULAR | Status: DC | PRN
  Administered 2019-06-20: 2 mg via INTRAVENOUS

## 2019-06-20 MED ORDER — PROPOFOL 10 MG/ML IV BOLUS
INTRAVENOUS | Status: DC | PRN
  Administered 2019-06-20: 200 mg via INTRAVENOUS

## 2019-06-20 MED ORDER — SUCCINYLCHOLINE CHLORIDE 200 MG/10ML IV SOSY
PREFILLED_SYRINGE | INTRAVENOUS | Status: DC | PRN
  Administered 2019-06-20: 140 mg via INTRAVENOUS

## 2019-06-20 MED ORDER — SODIUM CHLORIDE 0.9 % IV SOLN: INTRAVENOUS | Status: DC

## 2019-06-20 MED ORDER — IODIXANOL 320 MG/ML IV SOLN
INTRAVENOUS | Status: DC | PRN
  Administered 2019-06-20 (×2): 100 mL via INTRAVENOUS

## 2019-06-20 MED ORDER — DEXAMETHASONE SODIUM PHOSPHATE 10 MG/ML IJ SOLN
INTRAMUSCULAR | Status: DC | PRN
  Administered 2019-06-20: 4 mg via INTRAVENOUS

## 2019-06-20 MED ORDER — FENTANYL CITRATE (PF) 100 MCG/2ML IJ SOLN
25.0000 ug | INTRAMUSCULAR | Status: DC | PRN
  Administered 2019-06-20: 13:00:00 50 ug via INTRAVENOUS

## 2019-06-20 MED ORDER — ROPINIROLE HCL 1 MG PO TABS
2.0000 mg | ORAL_TABLET | Freq: Two times a day (BID) | ORAL | Status: DC
  Administered 2019-06-20 – 2019-06-21 (×2): 2 mg via ORAL
  Filled 2019-06-20 (×2): qty 2

## 2019-06-20 MED ORDER — ALBUMIN HUMAN 5 % IV SOLN
INTRAVENOUS | Status: DC | PRN
  Administered 2019-06-20: 10:00:00 via INTRAVENOUS

## 2019-06-20 MED ORDER — DEXMEDETOMIDINE HCL IN NACL 200 MCG/50ML IV SOLN
INTRAVENOUS | Status: DC | PRN
  Administered 2019-06-20 (×2): 8 ug via INTRAVENOUS

## 2019-06-20 MED ORDER — ASPIRIN EC 325 MG PO TBEC
325.0000 mg | DELAYED_RELEASE_TABLET | Freq: Every day | ORAL | Status: DC
  Administered 2019-06-20: 325 mg via ORAL
  Filled 2019-06-20: qty 1

## 2019-06-20 MED ORDER — MIDAZOLAM HCL 2 MG/2ML IJ SOLN
INTRAMUSCULAR | Status: AC
  Filled 2019-06-20: qty 2

## 2019-06-20 MED ORDER — ONDANSETRON HCL 4 MG/2ML IJ SOLN
INTRAMUSCULAR | Status: DC | PRN
  Administered 2019-06-20: 4 mg via INTRAVENOUS

## 2019-06-20 MED ORDER — GUAIFENESIN-DM 100-10 MG/5ML PO SYRP: 15.0000 mL | ORAL_SOLUTION | ORAL | Status: DC | PRN

## 2019-06-20 MED ORDER — MORPHINE SULFATE (PF) 2 MG/ML IV SOLN: 2.0000 mg | INTRAVENOUS | Status: DC | PRN

## 2019-06-20 MED ORDER — PHENYLEPHRINE 40 MCG/ML (10ML) SYRINGE FOR IV PUSH (FOR BLOOD PRESSURE SUPPORT)
PREFILLED_SYRINGE | INTRAVENOUS | Status: AC
  Filled 2019-06-20: qty 10

## 2019-06-20 MED ORDER — ALPRAZOLAM 0.5 MG PO TABS: 1.0000 mg | ORAL_TABLET | Freq: Two times a day (BID) | ORAL | Status: DC | PRN

## 2019-06-20 MED ORDER — HEPARIN SODIUM (PORCINE) 5000 UNIT/ML IJ SOLN
5000.0000 [IU] | Freq: Three times a day (TID) | INTRAMUSCULAR | Status: DC
  Administered 2019-06-21 (×2): 5000 [IU] via SUBCUTANEOUS
  Filled 2019-06-20 (×2): qty 1

## 2019-06-20 MED ORDER — ALUM & MAG HYDROXIDE-SIMETH 200-200-20 MG/5ML PO SUSP: 15.0000 mL | ORAL | Status: DC | PRN

## 2019-06-20 MED ORDER — ACETAMINOPHEN 325 MG RE SUPP: 325.0000 mg | RECTAL | Status: DC | PRN

## 2019-06-20 MED ORDER — PANTOPRAZOLE SODIUM 40 MG PO TBEC
40.0000 mg | DELAYED_RELEASE_TABLET | Freq: Every day | ORAL | Status: DC
  Administered 2019-06-21: 40 mg via ORAL
  Filled 2019-06-20: qty 1

## 2019-06-20 MED ORDER — ACETAMINOPHEN 325 MG PO TABS: 325.0000 mg | ORAL_TABLET | ORAL | Status: DC | PRN

## 2019-06-20 SURGICAL SUPPLY — 72 items
BAG BANDED W/RUBBER/TAPE 36X54 (MISCELLANEOUS) ×2 IMPLANT
CANISTER SUCT 3000ML PPV (MISCELLANEOUS) ×4 IMPLANT
CATH BEACON 5 .035 65 KMP TIP (CATHETERS) ×2 IMPLANT
CATH OMNI FLUSH .035X70CM (CATHETERS) ×2 IMPLANT
CATH OMNI FLUSH 5F 65CM (CATHETERS) ×2 IMPLANT
CHLORAPREP W/TINT 26 (MISCELLANEOUS) ×2 IMPLANT
CLIP VESOCCLUDE MED 24/CT (CLIP) ×4 IMPLANT
CLIP VESOCCLUDE SM WIDE 24/CT (CLIP) ×4 IMPLANT
COVER DOME SNAP 22 D (MISCELLANEOUS) ×2 IMPLANT
COVER WAND RF STERILE (DRAPES) ×2 IMPLANT
DERMABOND ADHESIVE PROPEN (GAUZE/BANDAGES/DRESSINGS) ×2
DERMABOND ADVANCED (GAUZE/BANDAGES/DRESSINGS) ×2
DERMABOND ADVANCED .7 DNX12 (GAUZE/BANDAGES/DRESSINGS) ×2 IMPLANT
DERMABOND ADVANCED .7 DNX6 (GAUZE/BANDAGES/DRESSINGS) IMPLANT
DEVICE TORQUE KENDALL .025-038 (MISCELLANEOUS) ×2 IMPLANT
DRAIN CHANNEL 15F RND FF W/TCR (WOUND CARE) IMPLANT
DRAPE C-ARM 42X72 X-RAY (DRAPES) ×2 IMPLANT
ELECT REM PT RETURN 9FT ADLT (ELECTROSURGICAL) ×4
ELECTRODE REM PT RTRN 9FT ADLT (ELECTROSURGICAL) ×2 IMPLANT
EVACUATOR SILICONE 100CC (DRAIN) IMPLANT
FELT TEFLON 1X6 (MISCELLANEOUS) ×2 IMPLANT
GAUZE 4X4 16PLY RFD (DISPOSABLE) IMPLANT
GLOVE BIO SURGEON STRL SZ7.5 (GLOVE) ×12 IMPLANT
GLOVE BIOGEL PI IND STRL 6.5 (GLOVE) IMPLANT
GLOVE BIOGEL PI IND STRL 8 (GLOVE) ×2 IMPLANT
GLOVE BIOGEL PI INDICATOR 6.5 (GLOVE) ×6
GLOVE BIOGEL PI INDICATOR 8 (GLOVE) ×2
GLOVE ECLIPSE 8.0 STRL XLNG CF (GLOVE) ×4 IMPLANT
GOWN STRL REUS W/ TWL LRG LVL3 (GOWN DISPOSABLE) ×6 IMPLANT
GOWN STRL REUS W/ TWL XL LVL3 (GOWN DISPOSABLE) ×4 IMPLANT
GOWN STRL REUS W/TWL LRG LVL3 (GOWN DISPOSABLE) ×6
GOWN STRL REUS W/TWL XL LVL3 (GOWN DISPOSABLE) ×8
GUIDEWIRE ANGLED .035X150CM (WIRE) ×2 IMPLANT
HEMOSTAT SNOW SURGICEL 2X4 (HEMOSTASIS) ×4 IMPLANT
HEMOSTAT SPONGE AVITENE ULTRA (HEMOSTASIS) IMPLANT
INSERT FOGARTY SM (MISCELLANEOUS) ×2 IMPLANT
KIT BASIN OR (CUSTOM PROCEDURE TRAY) ×4 IMPLANT
KIT ENCORE 26 ADVANTAGE (KITS) ×2 IMPLANT
KIT TURNOVER KIT B (KITS) ×4 IMPLANT
NS IRRIG 1000ML POUR BTL (IV SOLUTION) ×8 IMPLANT
PACK ENDO MINOR (CUSTOM PROCEDURE TRAY) ×2 IMPLANT
PACK PERIPHERAL VASCULAR (CUSTOM PROCEDURE TRAY) ×4 IMPLANT
PAD ARMBOARD 7.5X6 YLW CONV (MISCELLANEOUS) ×10 IMPLANT
PATCH VASC XENOSURE 1CMX6CM (Vascular Products) ×2 IMPLANT
PATCH VASC XENOSURE 1X6 (Vascular Products) IMPLANT
SET MICROPUNCTURE 5F STIFF (MISCELLANEOUS) ×2 IMPLANT
SHEATH BRITE TIP 8FR 23CM (SHEATH) ×2 IMPLANT
SHEATH PINNACLE 5F 10CM (SHEATH) ×4 IMPLANT
SPONGE LAP 18X18 X RAY DECT (DISPOSABLE) ×2 IMPLANT
STENT VIABAHNBX 8X39X135 (Permanent Stent) ×2 IMPLANT
SUT MNCRL AB 4-0 PS2 18 (SUTURE) ×6 IMPLANT
SUT PROLENE 5 0 C 1 24 (SUTURE) ×14 IMPLANT
SUT PROLENE 6 0 BV (SUTURE) ×10 IMPLANT
SUT SILK 2 0 PERMA HAND 18 BK (SUTURE) IMPLANT
SUT VIC AB 2-0 CT1 27 (SUTURE) ×2
SUT VIC AB 2-0 CT1 TAPERPNT 27 (SUTURE) ×4 IMPLANT
SUT VIC AB 2-0 CTB1 (SUTURE) ×4 IMPLANT
SUT VIC AB 3-0 SH 27 (SUTURE) ×4
SUT VIC AB 3-0 SH 27X BRD (SUTURE) ×4 IMPLANT
SYR 10ML LL (SYRINGE) ×2 IMPLANT
SYR 20ML LL LF (SYRINGE) ×4 IMPLANT
SYR 30ML LL (SYRINGE) ×2 IMPLANT
SYR MEDRAD MARK V 150ML (SYRINGE) ×2 IMPLANT
TOWEL GREEN STERILE (TOWEL DISPOSABLE) ×4 IMPLANT
TRAY FOLEY MTR SLVR 16FR STAT (SET/KITS/TRAYS/PACK) ×4 IMPLANT
TUBING HIGH PRESSURE 120CM (CONNECTOR) IMPLANT
TUBING INJECTOR 48 (MISCELLANEOUS) ×2 IMPLANT
UNDERPAD 30X30 (UNDERPADS AND DIAPERS) ×2 IMPLANT
WATER STERILE IRR 1000ML POUR (IV SOLUTION) ×4 IMPLANT
WIRE AMPLATZ SS-J .035X180CM (WIRE) ×2 IMPLANT
WIRE AMPLATZ SS-J .035X260CM (WIRE) ×2 IMPLANT
WIRE BENTSON .035X145CM (WIRE) ×2 IMPLANT

## 2019-06-20 NOTE — Progress Notes (Signed)
Verbal order received from Carlis Abbott, MD for xanax 1mg  BID PRN for anxiety, ACHS with a moderate scale insulin coverage,dc foley, dc a line. Will continue to monitor.

## 2019-06-20 NOTE — Progress Notes (Signed)
Patient arrived the unit on a hospital bed, assessment completed see flowsheet, placed on tele ccmd notified, patient oriented to room and staff, bed in lowest position, call bell within reach will continue  to monitor.

## 2019-06-20 NOTE — Progress Notes (Signed)
Pharmacy Contacted for med rec to be completed

## 2019-06-20 NOTE — Anesthesia Procedure Notes (Addendum)
Arterial Line Insertion Start/End8/17/2020 6:45 AM, 06/20/2019 6:50 AM Performed by: Renato Shin, CRNA, CRNA  Preanesthetic checklist: patient identified, IV checked, site marked, risks and benefits discussed, surgical consent, monitors and equipment checked, pre-op evaluation, timeout performed and anesthesia consent Lidocaine 1% used for infiltration Right, radial was placed Catheter size: 20 G Hand hygiene performed  and maximum sterile barriers used  Allen's test indicative of satisfactory collateral circulation Attempts: 1 Procedure performed without using ultrasound guided technique. Following insertion, Biopatch and dressing applied. Post procedure assessment: normal  Patient tolerated the procedure well with no immediate complications. Additional procedure comments: Insertion by SRNA.

## 2019-06-20 NOTE — Transfer of Care (Signed)
Immediate Anesthesia Transfer of Care Note  Patient: Jaysion Ramseyer  Procedure(s) Performed: ENDARTERECTOMY RIGHT COMMON FEMORAL (Right Groin) INSERTION OF COMMON ILIAC STENT RETROGRADE ACCESS using Gore Viabahn VBX Stent (Right Groin) Patch Angioplasty using Xenosure Biologic Patch (Right Groin)  Patient Location: PACU  Anesthesia Type:General  Level of Consciousness: awake, oriented and patient cooperative  Airway & Oxygen Therapy: Patient Spontanous Breathing and Patient connected to face mask oxygen  Post-op Assessment: Report given to RN and Post -op Vital signs reviewed and stable  Post vital signs: Reviewed and stable  Last Vitals:  Vitals Value Taken Time  BP 144/68 06/20/19 1134  Temp    Pulse 85 06/20/19 1141  Resp 13 06/20/19 1141  SpO2 95 % 06/20/19 1141  Vitals shown include unvalidated device data.  Last Pain:  Vitals:   06/20/19 0629  TempSrc: Oral  PainSc:       Patients Stated Pain Goal: 3 (62/83/15 1761)  Complications: No apparent anesthesia complications

## 2019-06-20 NOTE — Anesthesia Preprocedure Evaluation (Signed)
Anesthesia Evaluation  Patient identified by MRN, date of birth, ID band Patient awake    Reviewed: Allergy & Precautions, NPO status , Patient's Chart, lab work & pertinent test results  Airway Mallampati: II  TM Distance: >3 FB     Dental   Pulmonary former smoker,    breath sounds clear to auscultation       Cardiovascular hypertension, + Peripheral Vascular Disease   Rhythm:Regular Rate:Normal     Neuro/Psych    GI/Hepatic Neg liver ROS, GERD  ,  Endo/Other  diabetes  Renal/GU Renal disease     Musculoskeletal   Abdominal   Peds  Hematology   Anesthesia Other Findings   Reproductive/Obstetrics                             Anesthesia Physical Anesthesia Plan  ASA: III  Anesthesia Plan: General   Post-op Pain Management:    Induction: Intravenous  PONV Risk Score and Plan: 2 and Ondansetron, Dexamethasone and Midazolam  Airway Management Planned: Oral ETT and Video Laryngoscope Planned  Additional Equipment:   Intra-op Plan:   Post-operative Plan: Possible Post-op intubation/ventilation  Informed Consent: I have reviewed the patients History and Physical, chart, labs and discussed the procedure including the risks, benefits and alternatives for the proposed anesthesia with the patient or authorized representative who has indicated his/her understanding and acceptance.     Dental advisory given  Plan Discussed with: CRNA and Anesthesiologist  Anesthesia Plan Comments:         Anesthesia Quick Evaluation

## 2019-06-20 NOTE — H&P (Signed)
History and Physical Interval Note:  06/20/2019 7:14 AM  Gabriel RungJoe Jessy OtoHutson  has presented today for surgery, with the diagnosis of claudication.  The various methods of treatment have been discussed with the patient and family. After consideration of risks, benefits and other options for treatment, the patient has consented to  Procedure(s): ENDARTERECTOMY RIGHT COMMON FEMORAL (Right) INSERTION OF COMMON ILIAC STENT RETROGRADE ACCESS (Right) BYPASS GRAFT FEMORAL-FEMORAL ARTERY (N/A) as a surgical intervention.  The patient's history has been reviewed, patient examined, no change in status, stable for surgery.  I have reviewed the patient's chart and labs.  Questions were answered to the patient's satisfaction.    Right femoral endarterectomy, retrograde right common iliac stent.  Possible left to right fem fem bypass.  Recent diagnostic arteriogram.  Discussed risks and benefits including wound healing problems, infection, bleeding, return to OR, stent failure, bypass failure, etc.  Cephus Shellinghristopher J Omero Kowal  Patient name:Graylin HutsonMRN: [id number] DOB: [birth year]Sex: male  REASON FOR CONSULT:Evaluate for PVD  HPI: Keoki Hutsonis a 68 y.o.male,with history of hypertension, hyperlipidemia, diabetes, chronic back pain, morbid obesity thatpresents for evaluation of PVD. Patient describes years of lower extremity pain in his calfs and thighs when he walks consistent with claudication. He states this has been progressing severely over the last 3 to 6 months or so. 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 He is also the primary caregiver athome andstates hehas to cook and do house duties for his wife who is severely disabled with COPD and states he is having trouble getting around the house. In addition he isstarting to have some discomfort in the legs at night and is being treated for restless leg  syndrome. He denies any previous lower extremity wounds or tissue loss. He states his right leg is worse than his left. [facility] but states that he had no official follow-up for intervention - but states an intervention was offered. [facility] year[facility] although I do not have the images suggest a 90% right common iliac stenosis as well as bilateral high-grade stenosis of his common femoral arteries andbilateral SFA disease andat least three-vessel runoff bilaterally.      Past Medical History:  Diagnosis Date  . Anemia   . Chronic back pain   . Depression   . Diabetes mellitus without complication (HCC)   . Hyperlipidemia   . Hypertension          Past Surgical History:  Procedure Laterality Date  . ACHILLES TENDON REPAIR Right   . GASTRIC BYPASS    . REPLACEMENT TOTAL KNEE Right   . RIGHT/LEFT HEART CATH AND CORONARY ANGIOGRAPHY N/A 01/28/2018   Procedure: RIGHT/LEFT HEART CATH AND CORONARY ANGIOGRAPHY; Surgeon: Runell GessBerry, Jonathan J, MD; Location: [facility] INVASIVE CV LAB; Service: Cardiovascular; Laterality: N/A;         Family History  Problem Relation Age of Onset  . Depression Mother   . Anxiety disorder Mother   . Hypertension Mother   . Depression Father   . Diabetes Mellitus II Father   . Anxiety disorder Father   . Hypertension Father     SOCIAL HISTORY: Social History        Socioeconomic History  . Marital status: Single    Spouse name: Not on file  . Number of children: Not on file  . Years of education: Not on file  .  Highest education level: Not on file  Occupational History  . Not on file  Social Needs  . Financial resource strain: Not on file  . Food insecurity    Worry: Not on file    Inability: Not on file  . Transportation needs    Medical: Not on file    Non-medical: Not on file  Tobacco Use  . Smoking status: Former  Research scientist (life sciences)  . Smokeless tobacco: Never Used  Substance and Sexual Activity  . Alcohol use: No    Frequency: Never  . Drug use: No  . Sexual activity: Not on file  Lifestyle  . Physical activity    Days per week: Not on file    Minutes per session: Not on file  . Stress: Not on file  Relationships  . Social Herbalist on phone: Not on file    Gets together: Not on file    Attends religious service: Not on file    Active member of club or organization: Not on file    Attends meetings of clubs or organizations: Not on file    Relationship status: Not on file  . Intimate partner violence    Fear of current or ex partner: Not on file    Emotionally abused: Not on file    Physically abused: Not on file    Forced sexual activity: Not on file  Other Topics Concern  . Not on file  Social History Narrative  . Not on file        Allergies  Allergen Reactions  . Atorvastatin Other (See Comments)  . Bee Venom Swelling          Current Outpatient Medications  Medication Sig Dispense Refill  . aspirin EC 325 MG tablet Take 325 mg by mouth at bedtime.     Marland Kitchen buPROPion (WELLBUTRIN) 100 MG tablet Take 100 mg by mouth.    . gabapentin (NEURONTIN) 300 MG capsule Take 900 mg by mouth at bedtime.     Marland Kitchen lisinopril-hydrochlorothiazide (PRINZIDE,ZESTORETIC) 20-12.5 MG tablet TAKE 1 TABLET by mouth daily    . Multiple Vitamins-Minerals (CENTRUM SILVER 50+MEN PO) Take 1 tablet by mouth daily.    Marland Kitchen omeprazole (PRILOSEC) 40 MG capsule Take 40 mg by mouth 2 (two) times daily.    Marland Kitchen oxyCODONE (ROXICODONE) 15 MG immediate release tablet Take 15 mg by mouth 4 (four) times daily.  0  . pioglitazone (ACTOS) 30 MG tablet Take 30 mg by mouth daily.  3  . pravastatin (PRAVACHOL) 20 MG tablet Take 20 mg by mouth at bedtime.    Marland Kitchen rOPINIRole (REQUIP) 2 MG tablet TAKE 2 mg TWICE DAILY    . sertraline (ZOLOFT) 100 MG tablet Take 100 mg by mouth  daily.     No current facility-administered medications for this visit.    REVIEW OF SYSTEMS: [X]  denotes positive finding, [ ]  denotes negative finding Cardiac  Comments:  Chest pain or chest pressure:    Shortness of breath upon exertion:    Short of breath when lying flat:    Irregular heart rhythm:        Vascular    Pain in calf, thigh, or hip brought on by ambulation: x Right worse than left  Pain in feet at night that wakes you up from your sleep: x Being treated for restless legs as well  Blood clot in your veins:    Leg swelling:         Pulmonary  Oxygen at home:    Productive cough:     Wheezing:         Neurologic    Sudden weakness in arms or legs:     Sudden numbness in arms or legs:     Sudden onset of difficulty speaking or slurred speech:    Temporary loss of vision in one eye:     Problems with dizziness:         Gastrointestinal    Blood in stool:     Vomited blood:         Genitourinary    Burning when urinating:     Blood in urine:        Psychiatric    Major depression:         Hematologic    Bleeding problems:    Problems with blood clotting too easily:        Skin    Rashes or ulcers:        Constitutional    Fever or chills:      PHYSICAL EXAM:    Vitals:   05/17/19 0955  BP: 131/67  Pulse: 62  Resp: 20  Temp: (!) 97.3 F (36.3 C)  SpO2: 98%  Weight: (!) 328 lb (148.8 kg)  Height: 5\' 7"  (1.702 m)    GENERAL: The patient is a well-nourishedmale, in no acute distress. The vital signs are documented above. CARDIAC:There is a regular rate and rhythm.  VASCULAR: 2+ radial pulse palpable bilaterally No palpable right femoral pulse Weak left femoral pulse palpable No pedal or popliteal pulses palpable PULMONARY: There is good air exchange bilaterally without wheezing or rales. ABDOMEN:Soft and non-tender with  normal pitched bowel sounds. Obese. Large pannus. MUSCULOSKELETAL:There are no major deformities or cyanosis. NEUROLOGIC:No focal weakness or paresthesias are detected. SKIN:There are no ulcers or rashes noted. PSYCHIATRIC:The patient has a normal affect.  DATA:  Iindependently reviewed his ABIs which are 0.92 on the right and monophasic and 1.04 on the left and monophasic severelydepressed toe pressures  Assessment/Plan:  68 year old male with multiple medical problems as noted above the presents with progressivelifestyle limiting short distance claudicationwith concern for some early rest pain symptoms particularly in the right leg. I reviewed the report [facility] that suggest 90% right common iliac stenosis as well as bilateral 90% common femoral artery stenosis and bilateral SFA disease with three-vessel runoff. I can appreciate a weak left femoral pulse. Discussed plan for aortogram with bilateral lower extremity arteriogram in the Cath Lab. Discussed this would likely be diagnostic and given that his right leg is worse probably make plans for right common femoral endarterectomy with retrograde iliac stenting in the OR. However we will get some updated imaging to ensure he does not need anything further.   Cephus Shellinghristopher J. Sherrell Farish, MD [facility] [place] Office: [phone]

## 2019-06-20 NOTE — Anesthesia Postprocedure Evaluation (Signed)
Anesthesia Post Note  Patient: [name]  Procedure(s) Performed: ENDARTERECTOMY RIGHT COMMON FEMORAL (Right Groin) INSERTION OF COMMON ILIAC STENT RETROGRADE ACCESS using Gore Viabahn VBX Stent (Right Groin) Patch Angioplasty using Xenosure Biologic Patch (Right Groin)     Patient location during evaluation: PACU Anesthesia Type: General Level of consciousness: awake Pain management: pain level controlled Vital Signs Assessment: post-procedure vital signs reviewed and stable Respiratory status: spontaneous breathing Cardiovascular status: stable Postop Assessment: no apparent nausea or vomiting Anesthetic complications: no    Last Vitals:  Vitals:   06/20/19 1630 06/20/19 1754  BP:  (!) 150/74  Pulse:    Resp: 18 18  Temp:    SpO2:  100%    Last Pain:  Vitals:   06/20/19 1627  TempSrc:   PainSc: 8                  Lachae Hohler

## 2019-06-20 NOTE — Anesthesia Postprocedure Evaluation (Signed)
Anesthesia Post Note  Patient: [name]  Procedure(s) Performed: ENDARTERECTOMY RIGHT COMMON FEMORAL (Right Groin) INSERTION OF COMMON ILIAC STENT RETROGRADE ACCESS using Gore Viabahn VBX Stent (Right Groin) Patch Angioplasty using Xenosure Biologic Patch (Right Groin)     Patient location during evaluation: PACU Anesthesia Type: General Level of consciousness: awake Pain management: pain level controlled Respiratory status: spontaneous breathing Cardiovascular status: stable Postop Assessment: no apparent nausea or vomiting Anesthetic complications: no    Last Vitals:  Vitals:   06/20/19 1630 06/20/19 1754  BP:  (!) 150/74  Pulse:    Resp: 18 18  Temp:    SpO2:  100%    Last Pain:  Vitals:   06/20/19 1627  TempSrc:   PainSc: 8                  Jamesen Stahnke

## 2019-06-20 NOTE — Anesthesia Procedure Notes (Signed)
Procedure Name: Intubation Date/Time: 06/20/2019 7:59 AM Performed by: Renato Shin, CRNA Pre-anesthesia Checklist: Patient identified, Emergency Drugs available, Suction available and Patient being monitored Patient Re-evaluated:Patient Re-evaluated prior to induction Oxygen Delivery Method: Circle system utilized Preoxygenation: Pre-oxygenation with 100% oxygen Induction Type: IV induction Ventilation: Mask ventilation without difficulty Laryngoscope Size: Glidescope and 4 Grade View: Grade I Tube type: Oral Tube size: 7.5 mm Number of attempts: 1 Airway Equipment and Method: Patient positioned with wedge pillow,  Rigid stylet and Video-laryngoscopy (ramped with blankets) Placement Confirmation: ETT inserted through vocal cords under direct vision,  positive ETCO2 and breath sounds checked- equal and bilateral Secured at: 23 cm Tube secured with: Tape Dental Injury: Teeth and Oropharynx as per pre-operative assessment  Future Recommendations: Recommend- induction with short-acting agent, and alternative techniques readily available Comments: Patient thoroughly pre-oxygenated, ramped and in sniffing position. VL electively used d/t body habitus. Careful insertion and removal of VL from mouth. ETT inserted atraumatically under continual VL visualization. Teeth and mouth remain as per original assessment.

## 2019-06-20 NOTE — Op Note (Signed)
Date: June 20, 2019  Preoperative diagnosis: Bilateral lower extremity short distance lifestyle limiting claudication with right common femoral artery high-grade stenosis and right common iliac artery high-grade stenosis  Postoperative diagnosis: Same  Procedure: 1.  Right common femoral artery endarterectomy with bovine pericardial patch angioplasty 2.  Retrograde right common iliac artery stent (8 mm x 39 mm VBX) with aortogram and right iliac arteriogram  Surgeon: Dr. Marty Heck, MD  Assistant: Dr. Curt Jews, MD and Arlee Muslim, [place]  Indications: Patient is a 68 year old male who was seen in consultation for bilateral lower extremity lifestyle limiting short distance claudication.  He was seen at [facility] last year with CT evidence of a high-grade right common iliac stenosis with near occlusion of both common femoral arteries.  Ultimately I took him back to the Cath Lab via left common femoral artery approach for updated imaging that again confirmed his right common iliac had a greater than 95% stenosis with a large calcified plaque and a near occlusion of his right common femoral artery.  He states his right leg is in much worse shape than his left leg.  He presents today for right common femoral endarterectomy with retrograde iliac stent after risks and benefits were discussed.  Findings: Large calcified near occlusive plaque in the right common femoral artery.  After endarterectomy, the right common femoral artery was patched with a bovine pericardial patch.  Excellent signals in the SFA and the profunda after patch angioplasty.  I then accessed this patch retrograde and a 8 mm x 39 mm VBX stent was placed in the right common iliac artery.  Patient an excellent right common femoral artery pulse and posterior tibial and dorsalis pedis signals in the right foot that were very brisk.  Anesthesia: General  Contrast: 53 mL  EBL: 700 mL  Details: The patient was taken to the  operating room after informed consent was obtained.  He was placed on operative table in supine position.  General endotracheal anesthesia was induced.  Given his large pannus we then taped his abdominal wall in order to get better exposure.  His bilateral groins and abdominal wall was then prepped and draped in usual sterile fashion.  A preop timeout was performed to identify patient, procedure and site.  Initially performed a transverse incision above his right inguinal crease given the large pannus.  Dissected down with Bovie cautery and blunt dissection and ultimately opened the femoral sheath transversely just under the inguinal ligament.  Once identified the common femoral artery which was calcified and hard I then opened the sheath further in a longitudinal direction over the artery.  Cerebellum retractors were used for added exposure.  Had to use large hand-held Wiley retractor to get added visualization under the inguinal ligament.  I did ligate the crossing circumflex vein with 2-0 silk ties and medium clips and divided this.  Ultimately I got Vesseloops around the SFA and the profunda and dissected out under the inguinal ligament to the distal external iliac where the artery was soft and there was a pulse and a location to place a Henley clamp.  Patient was then given 100 units/kg heparin and ACT was checked to maintain greater than 250.  I then pulled up on my Vesseloops on the SFA and profunda clamp and the distal external iliac was controlled with a Henley clamp.  Common femoral artery was then opened 11 blade scalpel extended with Potts scissors.  There was a large circumferential plaque that was nearly occlusive  extending down to the takeoff of the profunda.  I developed a plane with a Garment/textile technologist and a plaque had a nice endpoint proximally and then I took the plaque all the way down the profunda where then truncated it.  Had a fairly nice distal endpoint.  I did use three 6-0 Prolene's for  distal tacking sutures.  There was excellent backbleeding from the SFA and the profunda.  I then brought a bovine pericardial patch on the field and this was trimmed accordingly and sewn parachute format using 5-0 Prolene.  I did de-air prior to completion of the patch.  I then came off clamps and had excellent signal in the SFA and the profunda.  There was a small tear on the right lateral wall of the common femoral where the endarterectomy was thin.  This was repaired with a 6-0 Prolene with two pledgets.  At that point in time patient then had evidence of a right femoral pulse.  I then used a micro access needle and stuck the common femoral artery patch in retrograde fashion.  I passed a microwire and then placed a micro-sheath and met no resistance.  Through this I then used a soft angled Glidewire to cross the iliac high-grade stenosis retrograde into the native aorta.  I then placed a short 5 [place] sheath.  That point time I put a Omni Flush catheter up in the aorta and we get aortogram with dedicated right iliac arteriogram.  Ultimately we selected an 8 mm VBX.  The landmarks were then marked on the screen accordingly.  I then placed a long Amplatz wire up into the aorta and then upsized to a large 8 French bright tip sheath in the right groin and then pushed that sheath up across the iliac stenosis.  I then placed an 8 mm x 39 mm VBX after I pulled my sheath down.  The stent was then deployed to nominal pressure of 12 mmHg right across the high-grade stenosis in the right common iliac.  There was a lot of waist in the balloon initially and finally when we got to 12 mmHg we were able to iron out the stent.  Patient then had an excellent right femoral pulse.  The stent delivery system was then removed.  I then pulled my sheath nearly completely back and then reclamped the distal external back with a Henley clamp.  The sheath was then removed.  I then removed the wire.  The arteriotomy in the bovine  pericardial patch was repaired with 5-0 Prolene.  I went and checked the SFA and profunda and had excellent triphasic signals.  The patient had excellent dorsalis pedis and posterior tibial signals in the foot.  Patient was given 50 mg protamine for reversal.  I washed out the groin again and used Surgicel snow for hemostasis.  I did place several additional 5-0 Prolene patch sutures as well as one additional pledget in the suture line.  I then closed the femoral sheath with 2-0 Vicryl.  I then closed the subcutaneous tissue and additional layer of 2-0 Vicryl 3-0 Vicryl and 4-0 Monocryl in the skin Dermabond was applied.  I checked the foot again w and an excellent dorsalis pedis and posterior tibial signal.  Condition: Stable  Complication: None  Marty Heck, MD [facility] [place] Office: [phone] Pager: [place]

## 2019-06-21 ENCOUNTER — Inpatient Hospital Stay (HOSPITAL_COMMUNITY): Payer: Medicare HMO

## 2019-06-21 ENCOUNTER — Encounter (HOSPITAL_COMMUNITY): Payer: Self-pay | Admitting: Vascular Surgery

## 2019-06-21 DIAGNOSIS — I739 Peripheral vascular disease, unspecified: Secondary | ICD-10-CM

## 2019-06-21 LAB — BASIC METABOLIC PANEL
Anion gap: 11 (ref 5–15)
BUN: 26 mg/dL — ABNORMAL HIGH (ref 8–23)
CO2: 24 mmol/L (ref 22–32)
Calcium: 8.6 mg/dL — ABNORMAL LOW (ref 8.9–10.3)
Chloride: 99 mmol/L (ref 98–111)
Creatinine, Ser: 1.77 mg/dL — ABNORMAL HIGH (ref 0.61–1.24)
GFR calc Af Amer: 46 mL/min — ABNORMAL LOW (ref >60)
GFR calc non Af Amer: 39 mL/min — ABNORMAL LOW (ref >60)
Glucose, Bld: 150 mg/dL — ABNORMAL HIGH (ref 70–99)
Potassium: 4.2 mmol/L (ref 3.5–5.1)
Sodium: 134 mmol/L — ABNORMAL LOW (ref 135–145)

## 2019-06-21 LAB — CBC
HCT: 30.2 % — ABNORMAL LOW (ref 39.0–52.0)
Hemoglobin: 9.4 g/dL — ABNORMAL LOW (ref 13.0–17.0)
MCH: 27.2 pg (ref 26.0–34.0)
MCHC: 31.1 g/dL (ref 30.0–36.0)
MCV: 87.5 fL (ref 80.0–100.0)
Platelets: 231 10*3/uL (ref 150–400)
RBC: 3.45 MIL/uL — ABNORMAL LOW (ref 4.22–5.81)
RDW: 14.4 % (ref 11.5–15.5)
WBC: 11.5 10*3/uL — ABNORMAL HIGH (ref 4.0–10.5)
nRBC: 0 % (ref 0.0–0.2)

## 2019-06-21 LAB — GLUCOSE, CAPILLARY
Glucose-Capillary: 136 mg/dL — ABNORMAL HIGH (ref 70–99)
Glucose-Capillary: 142 mg/dL — ABNORMAL HIGH (ref 70–99)

## 2019-06-21 MED ORDER — CLOPIDOGREL BISULFATE 75 MG PO TABS
300.0000 mg | ORAL_TABLET | Freq: Once | ORAL | Status: AC
  Administered 2019-06-21: 300 mg via ORAL
  Filled 2019-06-21: qty 4

## 2019-06-21 MED ORDER — CLOPIDOGREL BISULFATE 75 MG PO TABS: 75.0000 mg | ORAL_TABLET | Freq: Every day | ORAL | 2 refills | Status: AC

## 2019-06-21 MED ORDER — OXYCODONE HCL 15 MG PO TABS: 15.0000 mg | ORAL_TABLET | Freq: Four times a day (QID) | ORAL | 0 refills | Status: AC

## 2019-06-21 NOTE — Progress Notes (Signed)
ABIs completed. Preliminary report pending in Chart review CV Proc. [place] Cynthia Cogle,RVS 06/21/2019 4:09 PM

## 2019-06-21 NOTE — Progress Notes (Signed)
IV's and telemetry discontinued. CCMD notified. Discharge instructions reviewed with patient. All questions answered.  K. Starr Juliany Daughety, RN 

## 2019-06-21 NOTE — Evaluation (Addendum)
Occupational Therapy Evaluation and Discharge  Patient Details Name: [name] MRN: [id number] DOB: 1950-11-11 Today's Date: 06/21/2019    History of Present Illness Pt is a 68 y.o. male, with history of hypertension, diabetes, anemia, depression, chronic back pain, morbid obesity that presents for evaluation of  PVD. S/P Right common femoral artery endarterectomy with bovine pericardial patch angioplasty, Retrograde right common iliac artery stent (8 mm x 39 mm VBX) with aortogram and right iliac arteriogram.    Clinical Impression   PTA patient independent. Admitted for above and limited by problem list below, including pain.  He demonstrates ability to complete ADLs, transfers and mobility in room with modified independence.  Typically uses sock aide for LB dressing, and reviewed compensatory techniques (donning R LE first) and seated for safety.  Pt reliant on grab bars for toilet transfers and tub transfers (simulated) but has them installed at home.  Based on performance today, no further OT needs have been identified and OT will sign off. Thank you for this referral.     Follow Up Recommendations  No OT follow up    Equipment Recommendations  None recommended by OT    Recommendations for Other Services       Precautions / Restrictions Precautions Precautions: Fall Restrictions Weight Bearing Restrictions: No      Mobility Bed Mobility               General bed mobility comments: EOB upon entry  Transfers Overall transfer level: Modified independent               General transfer comment: no assist required, reliant on grab bars support for toilet transfers    Balance Overall balance assessment: No apparent balance deficits (not formally assessed)                                         ADL either performed or assessed with clinical judgement   ADL Overall ADL's : Modified independent                                       General ADL Comments: patient demonstrates ability to complete toilet and shower (simulated) transfers with modified independence using grabbars, LB dressing with modified independence (at baseline uses sock aide) reviewed safety seated and compensatory dressing techniques, and energy conservation      Vision         Perception     Praxis      Pertinent Vitals/Pain Pain Assessment: Faces Faces Pain Scale: Hurts little more Pain Location: R LE  Pain Descriptors / Indicators: Discomfort;Operative site guarding Pain Intervention(s): Monitored during session;Repositioned     Hand Dominance     Extremity/Trunk Assessment Upper Extremity Assessment Upper Extremity Assessment: Overall WFL for tasks assessed   Lower Extremity Assessment Lower Extremity Assessment: Defer to PT evaluation       Communication Communication Communication: No difficulties   Cognition Arousal/Alertness: Awake/alert Behavior During Therapy: WFL for tasks assessed/performed Overall Cognitive Status: Within Functional Limits for tasks assessed                                     General Comments  VSS, HR 89-99    Exercises  Shoulder Instructions      Home Living Family/patient expects to be discharged to:: Private residence Living Arrangements: Spouse/significant other Available Help at Discharge: Family;Available 24 hours/day Type of Home: House Home Access: Stairs to enter [employer] of Steps: 2 Entrance Stairs-Rails: (+ rail) Home Layout: One level     Bathroom Shower/Tub: Teacher, early years/pre: Standard     Home Equipment: Environmental consultant - 2 wheels;Cane - single point;Shower seat;Grab bars - toilet;Grab bars - tub/shower          Prior Functioning/Environment Level of Independence: Independent                 OT Problem List: Pain      OT Treatment/Interventions:      OT Goals(Current goals can be found in the care plan  section) Acute Rehab OT Goals Patient Stated Goal: home today  OT Goal Formulation: With patient  OT Frequency:     Barriers to D/C:            Co-evaluation              AM-PAC OT "6 Clicks" Daily Activity     Outcome Measure Help from another person eating meals?: None Help from another person taking care of personal grooming?: None Help from another person toileting, which includes using toliet, bedpan, or urinal?: None Help from another person bathing (including washing, rinsing, drying)?: None Help from another person to put on and taking off regular upper body clothing?: None Help from another person to put on and taking off regular lower body clothing?: None 6 Click Score: 24   End of Session    Activity Tolerance: Patient tolerated treatment well Patient left: with call bell/phone within reach;Other (comment)(seated EOB)  OT Visit Diagnosis: Pain;Other abnormalities of gait and mobility (R26.89) Pain - Right/Left: Right Pain - part of body: Leg                Time: 3151-7616 OT Time Calculation (min): 9 min Charges:  OT General Charges $OT Visit: 1 Visit OT Evaluation $OT Eval Low Complexity: 1 Low  Delight Stare, OT Acute Rehabilitation Services Pager [phone] Office [phone]   Delight Stare 06/21/2019, 9:48 AM

## 2019-06-21 NOTE — Plan of Care (Signed)
Patient discharging home  

## 2019-06-21 NOTE — Discharge Instructions (Signed)
Incision Care, Adult °An incision is a surgical cut that is made through your skin. Most incisions are closed after surgery. Your incision may be closed with stitches (sutures), staples, skin glue, or adhesive strips. You may need to return to your health care provider to have sutures or staples removed. This may occur several days to several weeks after your surgery. The incision needs to be cared for properly to prevent infection. °How to care for your incision °Incision care ° °· Follow instructions from your health care provider about how to take care of your incision. Make sure you: °? Wash your hands with soap and water before you change the bandage (dressing). If soap and water are not available, use hand sanitizer. °? Change your dressing as told by your health care provider. °? Leave sutures, skin glue, or adhesive strips in place. These skin closures may need to stay in place for 2 weeks or longer. If adhesive strip edges start to loosen and curl up, you may trim the loose edges. Do not remove adhesive strips completely unless your health care provider tells you to do that. °· Check your incision area every day for signs of infection. Check for: °? More redness, swelling, or pain. °? More fluid or blood. °? Warmth. °? Pus or a bad smell. °· Ask your health care provider how to clean the incision. This may include: °? Using mild soap and water. °? Using a clean towel to pat the incision dry after cleaning it. °? Applying a cream or ointment. Do this only as told by your health care provider. °? Covering the incision with a clean dressing. °· Ask your health care provider when you can leave the incision uncovered. °· Do not take baths, swim, or use a hot tub until your health care provider approves. Ask your health care provider if you can take showers. You may only be allowed to take sponge baths for bathing. °Medicines °· If you were prescribed an antibiotic medicine, cream, or ointment, take or apply the  antibiotic as told by your health care provider. Do not stop taking or applying the antibiotic even if your condition improves. °· Take over-the-counter and prescription medicines only as told by your health care provider. °General instructions °· Limit movement around your incision to improve healing. °? Avoid straining, lifting, or exercise for the first month, or for as long as told by your health care provider. °? Follow instructions from your health care provider about returning to your normal activities. °? Ask your health care provider what activities are safe. °· Protect your incision from the sun when you are outside for the first 6 months, or for as long as told by your health care provider. Apply sunscreen around the scar or cover it up. °· Keep all follow-up visits as told by your health care provider. This is important. °Contact a health care provider if: °· Your have more redness, swelling, or pain around the incision. °· You have more fluid or blood coming from the incision. °· Your incision feels warm to the touch. °· You have pus or a bad smell coming from the incision. °· You have a fever or shaking chills. °· You are nauseous or you vomit. °· You are dizzy. °· Your sutures or staples come undone. °Get help right away if: °· You have a red streak coming from your incision. °· Your incision bleeds through the dressing and the bleeding does not stop with gentle pressure. °· The edges of   your incision open up and separate. °· You have severe pain. °· You have a rash. °· You are confused. °· You faint. °· You have trouble breathing and a fast heartbeat. °This information is not intended to replace advice given to you by your health care provider. Make sure you discuss any questions you have with your health care provider. °Document Released: 05/09/2005 Document Revised: 10/22/2018 Document Reviewed: 05/07/2016 °Elsevier Patient Education © 2020 [employer] ° °

## 2019-06-21 NOTE — Evaluation (Signed)
Physical Therapy Evaluation Patient Details Name: [name] MRN: [id number] DOB: June 06, 1951 Today's Date: 06/21/2019   History of Present Illness  Pt is a 68 y.o. male, with history of hypertension, diabetes, anemia, depression, chronic back pain, morbid obesity that presents for evaluation of  PVD. S/P Right common femoral artery endarterectomy with bovine pericardial patch angioplasty, Retrograde right common iliac artery stent (8 mm x 39 mm VBX) with aortogram and right iliac arteriogram.   Clinical Impression  Pt is at or close to baseline functioning and should be safe at home his available level of assistance.  Pt shows to be independent in a home-like environment.  Uneven surfaces not tested.. There are no further acute PT needs.  Will sign off at this time.     Follow Up Recommendations No PT follow up    Equipment Recommendations  None recommended by PT    Recommendations for Other Services       Precautions / Restrictions Precautions Precautions: Fall Restrictions Weight Bearing Restrictions: No      Mobility  Bed Mobility               General bed mobility comments: EOB upon entry  Transfers Overall transfer level: Modified independent               General transfer comment: no assist required, reliant on grab bars support for toilet transfers  Ambulation/Gait Ambulation/Gait assistance: Independent(in a home-like environment) Gait Distance (Feet): 400 Feet Assistive device: None Gait Pattern/deviations: Step-through pattern Gait velocity: moderate Gait velocity interpretation: >2.62 ft/sec, indicative of community ambulatory General Gait Details: pt able to scan, change/increase speed, change directions abruptly, back up all without deviation  Stairs Stairs: Yes Stairs assistance: Modified independent (Device/Increase time) Stair Management: One rail Right;Step to pattern;Forwards Number of Stairs: 5 General stair comments: used step to  pattern for incision pain and chronic knee pain.  Wheelchair Mobility    Modified Rankin (Stroke Patients Only)       Balance Overall balance assessment: No apparent balance deficits (not formally assessed)                                           Pertinent Vitals/Pain Pain Assessment: Faces Faces Pain Scale: Hurts little more Pain Location: R LE  Pain Descriptors / Indicators: Discomfort;Operative site guarding Pain Intervention(s): Monitored during session    Home Living Family/patient expects to be discharged to:: Private residence Living Arrangements: Spouse/significant other Available Help at Discharge: Family;Available 24 hours/day Type of Home: House Home Access: Stairs to enter Entrance Stairs-Rails: (+ rail) Secretary/administratorntrance Stairs-Number of Steps: 2 Home Layout: One level Home Equipment: Walker - 2 wheels;Cane - single point;Shower seat;Grab bars - toilet;Grab bars - tub/shower      Prior Function Level of Independence: Independent               Hand Dominance        Extremity/Trunk Assessment   Upper Extremity Assessment Upper Extremity Assessment: Overall WFL for tasks assessed    Lower Extremity Assessment Lower Extremity Assessment: Overall WFL for tasks assessed       Communication   Communication: No difficulties  Cognition Arousal/Alertness: Awake/alert Behavior During Therapy: WFL for tasks assessed/performed Overall Cognitive Status: Within Functional Limits for tasks assessed  General Comments General comments (skin integrity, edema, etc.): VSS, HR 89-99    Exercises     Assessment/Plan    PT Assessment Patent does not need any further PT services  PT Problem List         PT Treatment Interventions      PT Goals (Current goals can be found in the Care Plan section)  Acute Rehab PT Goals Patient Stated Goal: home today  PT Goal Formulation: All  assessment and education complete, DC therapy    Frequency     Barriers to discharge        Co-evaluation               AM-PAC PT "6 Clicks" Mobility  Outcome Measure Help needed turning from your back to your side while in a flat bed without using bedrails?: None Help needed moving from lying on your back to sitting on the side of a flat bed without using bedrails?: None Help needed moving to and from a bed to a chair (including a wheelchair)?: None Help needed standing up from a chair using your arms (e.g., wheelchair or bedside chair)?: None Help needed to walk in hospital room?: None Help needed climbing 3-5 steps with a railing? : None 6 Click Score: 24    End of Session   Activity Tolerance: Patient tolerated treatment well Patient left: in chair;with call bell/phone within reach Nurse Communication: Mobility status PT Visit Diagnosis: Other abnormalities of gait and mobility (R26.89);Pain Pain - part of body: (right groin incision)    Time: 6213-0865 PT Time Calculation (min) (ACUTE ONLY): 12 min   Charges:   PT Evaluation $PT Eval Low Complexity: 1 Low          06/21/2019  Donnella Sham, PT Acute Rehabilitation Services [phone]  (pager) [phone]  (office)  Tessie Fass Dahlia Nifong 06/21/2019, 12:07 PM

## 2019-06-21 NOTE — Plan of Care (Signed)
Continue to monitor

## 2019-06-21 NOTE — Progress Notes (Signed)
[facility]  Subjective  - Doing well.  Voided since foley removed.   Objective 134/60 81 97.9 F (36.6 C) (Oral) 15 100%  Intake/Output Summary (Last 24 hours) at 06/21/2019 0755 Last data filed at 06/21/2019 0413 Gross per 24 hour  Intake 3407.14 ml  Output 3175 ml  Net 232.14 ml    Right groin incision c/d/i Right DP/PT signals brisk  Laboratory Lab Results: Recent Labs    06/20/19 0550 06/20/19 1040 06/21/19 0534  WBC 8.0  --  11.5*  HGB 10.1* 9.2* 9.4*  HCT 31.6* 27.0* 30.2*  PLT 244  --  231   BMET Recent Labs    06/20/19 0550 06/20/19 1040 06/21/19 0534  NA 135 134* 134*  K 4.0 4.5 4.2  CL 99  --  99  CO2 26  --  24  GLUCOSE 138* 175* 150*  BUN 27*  --  26*  CREATININE 1.90*  --  1.77*  CALCIUM 8.9  --  8.6*    COAG Lab Results  Component Value Date   INR 1.1 06/20/2019   INR 1.1 01/21/2018   No results found for: PTT  Assessment/Planning: POD #1 s/p right femoral endarterectomy with bovine patch and retrograde right common iliac artery stent.  Brisk R DP/PT signals.  Groin c/d/i.  Needs aspirin and plavix on discharge.  Wants to go home today.  Plan f/u in 3 weeks with me for right groin wound check.  Needs to keep incision clean and dry.  Plan for d/c home today.  Marty Heck 06/21/2019 7:55 AM --

## 2019-06-21 NOTE — Discharge Summary (Signed)
Physician Discharge Summary   Patient ID: [name] [id number] 67 y.o. 08-06-1951  Admit date: 06/20/2019  Discharge date and time: 06/21/19   Admitting Physician: Cephus Shellinghristopher J Clark, MD   Discharge Physician: same  Admission Diagnoses: claudication  Discharge Diagnoses: same  Admission Condition: fair  Discharged Condition: good  Indication for Admission: Right lower extremity short distance lifestyle limiting claudication  Hospital Course: [name] is a 68 year old male who is brought in as an outpatient on 06/20/2019 for elective surgery.  Due to right lower extremity short distance lifestyle limiting claudication he underwent right common femoral artery endarterectomy and patch angioplasty as well as retrograde right common iliac artery stenting by Dr. Chestine Sporelark.  He tolerated the procedure well and was admitted to the hospital postoperatively.  POD #1 postoperative pain was well controlled, he was tolerating a regular diet, and feeling ready for discharge home.  He will follow-up to see Dr. Chestine Sporelark in office in about 3 weeks.  He will be prescribed 2 to 3 days of narcotic pain medication for continued postoperative pain control.  He will also be prescribed 75 mg of Plavix to take daily.  It should also be noted that at the time of discharge patient had brisk right DP and PT signals by Doppler.  Discharge instructions were reviewed with the patient and he endorses understanding.  He will be discharged to home this morning in stable condition.  Consults: None  Treatments: surgery: Right common femoral artery endarterectomy with bovine pericardial patch angioplasty and right common iliac artery stent by Dr. Chestine Sporelark on 06/20/2019  Discharge Exam: See progress note 06/21/2019 Vitals:   06/21/19 0410 06/21/19 0824  BP: 134/60 (!) 125/45  Pulse:    Resp: 15 17  Temp: 97.9 F (36.6 C) 98.2 F (36.8 C)  SpO2: 100%      Disposition: Discharge disposition: 01-Home or Self  Care       Patient Instructions:  Allergies as of 06/21/2019      Reactions   Atorvastatin Other (See Comments)   Joint pain   Bee Venom Swelling   Major swelling at the site of the sting       Medication List    TAKE these medications   ALPRAZolam 1 MG tablet Commonly known as: XANAX Take 1 mg by mouth 3 (three) times daily as needed.   aspirin EC 325 MG tablet Take 325 mg by mouth at bedtime.   buPROPion 100 MG 12 hr tablet Commonly known as: WELLBUTRIN SR Take 100 mg by mouth at bedtime.   CENTRUM SILVER 50+MEN PO Take 1 tablet by mouth at bedtime.   clopidogrel 75 MG tablet Commonly known as: Plavix Take 1 tablet (75 mg total) by mouth daily.   colchicine 0.6 MG tablet Take 0.6 mg by mouth at bedtime.   doxazosin 8 MG tablet Commonly known as: CARDURA Take 8 mg by mouth at bedtime.   gabapentin 400 MG capsule Commonly known as: NEURONTIN Take 800 mg by mouth at bedtime.   lisinopril-hydrochlorothiazide 20-12.5 MG tablet Commonly known as: ZESTORETIC Take 1 tablet by mouth at bedtime.   omeprazole 40 MG capsule Commonly known as: PRILOSEC Take 40 mg by mouth 2 (two) times daily.   oxyCODONE 15 MG immediate release tablet Commonly known as: ROXICODONE Take 1 tablet (15 mg total) by mouth 4 (four) times daily.   Ozempic (0.25 or 0.5 MG/DOSE) 2 MG/1.5ML Sopn Generic drug: Semaglutide(0.25 or 0.5MG /DOS) Inject 0.25 mg into the skin every Saturday.  pioglitazone 30 MG tablet Commonly known as: ACTOS Take 30 mg by mouth at bedtime.   pravastatin 20 MG tablet Commonly known as: PRAVACHOL Take 20 mg by mouth at bedtime.   rOPINIRole 2 MG tablet Commonly known as: REQUIP Take 1-2 mg by mouth See admin instructions. Take 1mg  in the AM, 1mg  in the afternoon, then 2mg  at bedtime.   sertraline 100 MG tablet Commonly known as: ZOLOFT Take 100 mg by mouth at bedtime.   sucralfate 1 GM/10ML suspension Commonly known as: CARAFATE Take 1 g by mouth 2  (two) times daily as needed (acid reflux).      Activity: activity as tolerated Diet: regular diet Wound Care: keep wound clean and dry  Follow-up with Dr. Carlis Abbott in 3 weeks.  SignedDagoberto Ligas 06/21/2019 1:03 PM

## 2019-06-22 NOTE — Progress Notes (Signed)
ABIs completed. Preliminary report available in Chart review CV Proc. [place] Slaughter,RVS 06/21/2019 4:09 PM

## 2019-07-12 ENCOUNTER — Other Ambulatory Visit: Payer: Self-pay

## 2019-07-12 ENCOUNTER — Ambulatory Visit (INDEPENDENT_AMBULATORY_CARE_PROVIDER_SITE_OTHER): Payer: Self-pay | Admitting: Vascular Surgery

## 2019-07-12 ENCOUNTER — Encounter: Payer: Self-pay | Admitting: Vascular Surgery

## 2019-07-12 VITALS — BP 171/76 | HR 93 | Temp 97.8°F | Resp 18 | Ht 68.0 in | Wt 325.0 lb

## 2019-07-12 DIAGNOSIS — I739 Peripheral vascular disease, unspecified: Secondary | ICD-10-CM

## 2019-07-12 NOTE — Progress Notes (Signed)
Patient name: [name] MRN: [id number] DOB: Nov 12, 1950 Sex: male  REASON FOR VISIT: Postop check  HPI: [name] is a 68 y.o. male that presents for postop check after right common femoral artery endarterectomy with bovine patch and retrograde right common iliac stent for lifestyle limiting claudication on 06/20/2019.  States his groin has healed up without any issues.  States his right leg feels 70% better.  He is very pleased with his progress.  He also has high-grade left common femoral stenosis but states his back is more pressing issue wants to have back surgery first.  Feels his left leg is tolerable at this time.  Previous claudication in left leg as well.  Past Medical History:  Diagnosis Date  . Anemia   . Asthma    AS A CHILD  . Chronic back pain   . Chronic kidney disease    mild  . Depression    PTSD  . Diabetes mellitus without complication (HCC)   . GERD (gastroesophageal reflux disease)   . Hyperlipidemia   . Hypertension   . Peripheral vascular disease (HCC)   . Sleep apnea    can't wear a cpap    Past Surgical History:  Procedure Laterality Date  . ABDOMINAL AORTOGRAM W/LOWER EXTREMITY N/A 06/08/2019   Procedure: ABDOMINAL AORTOGRAM W/LOWER EXTREMITY;  Surgeon: Cephus Shellinglark, Verna Desrocher J, MD;  Location: [facility];  Service: Cardiovascular;  Laterality: N/A;  . ACHILLES TENDON REPAIR Right   . ENDARTERECTOMY FEMORAL Right 06/20/2019   Procedure: ENDARTERECTOMY RIGHT COMMON FEMORAL;  Surgeon: Cephus Shellinglark, Jiovani Mccammon J, MD;  [facility];  Service: Vascular;  Laterality: Right;  . GASTRIC BYPASS    . INSERTION OF ILIAC STENT Right 06/20/2019   Procedure: INSERTION OF COMMON ILIAC STENT RETROGRADE ACCESS using Gore Viabahn VBX Stent;  Surgeon: Cephus Shellinglark, Willamae Demby J, MD;  [facility];  Service: Vascular;  Laterality: Right;  . PATCH ANGIOPLASTY Right 06/20/2019   Procedure: Patch Angioplasty using Xenosure Biologic Patch;  Surgeon: Cephus Shellinglark, Narek Kniss J, MD;   [facility];  Service: Vascular;  Laterality: Right;  . REPLACEMENT TOTAL KNEE Right   . RIGHT/LEFT HEART CATH AND CORONARY ANGIOGRAPHY N/A 01/28/2018   Procedure: RIGHT/LEFT HEART CATH AND CORONARY ANGIOGRAPHY;  Surgeon: Runell GessBerry, Jonathan J, MD;  Location: [facility];  Service: Cardiovascular;  Laterality: N/A;  . TONSILLECTOMY      Family History  Problem Relation Age of Onset  . Depression Mother   . Anxiety disorder Mother   . Hypertension Mother   . Depression Father   . Diabetes Mellitus II Father   . Anxiety disorder Father   . Hypertension Father     SOCIAL HISTORY: Social History   Tobacco Use  . Smoking status: Former Games developermoker  . Smokeless tobacco: Never Used  . Tobacco comment: quit in 1996  Substance Use Topics  . Alcohol use: No    Frequency: Never    Allergies  Allergen Reactions  . Atorvastatin Other (See Comments)    Joint pain  . Bee Venom Swelling    Major swelling at the site of the sting     Current Outpatient Medications  Medication Sig Dispense Refill  . ALPRAZolam (XANAX) 1 MG tablet Take 1 mg by mouth 3 (three) times daily as needed.    Marland Kitchen. aspirin EC 325 MG tablet Take 325 mg by mouth at bedtime.     Marland Kitchen. buPROPion (WELLBUTRIN SR) 100 MG 12 hr tablet Take 100 mg by mouth at  bedtime.    . clopidogrel (PLAVIX) 75 MG tablet Take 1 tablet (75 mg total) by mouth daily. 30 tablet 2  . colchicine 0.6 MG tablet Take 0.6 mg by mouth at bedtime.    Marland Kitchen doxazosin (CARDURA) 8 MG tablet Take 8 mg by mouth at bedtime.    . gabapentin (NEURONTIN) 400 MG capsule Take 800 mg by mouth at bedtime.    Marland Kitchen lisinopril-hydrochlorothiazide (PRINZIDE,ZESTORETIC) 20-12.5 MG tablet Take 1 tablet by mouth at bedtime.     . Multiple Vitamins-Minerals (CENTRUM SILVER 50+MEN PO) Take 1 tablet by mouth at bedtime.     Marland Kitchen omeprazole (PRILOSEC) 40 MG capsule Take 40 mg by mouth 2 (two) times daily.    Marland Kitchen oxyCODONE (ROXICODONE) 15 MG immediate release tablet Take 1 tablet (15 mg  total) by mouth 4 (four) times daily. 15 tablet 0  . pioglitazone (ACTOS) 30 MG tablet Take 30 mg by mouth at bedtime.   3  . pravastatin (PRAVACHOL) 20 MG tablet Take 20 mg by mouth at bedtime.    Marland Kitchen rOPINIRole (REQUIP) 2 MG tablet Take 1-2 mg by mouth See admin instructions. Take 1mg  in the AM, 1mg  in the afternoon, then 2mg  at bedtime.    . Semaglutide,0.25 or 0.5MG /DOS, (OZEMPIC, 0.25 OR 0.5 MG/DOSE,) 2 MG/1.5ML SOPN Inject 0.25 mg into the skin every Saturday.     . sertraline (ZOLOFT) 100 MG tablet Take 100 mg by mouth at bedtime.     . sucralfate (CARAFATE) 1 GM/10ML suspension Take 1 g by mouth 2 (two) times daily as needed (acid reflux).     No current facility-administered medications for this visit.     REVIEW OF SYSTEMS:  [X]  denotes positive finding, [ ]  denotes negative finding Cardiac  Comments:  Chest pain or chest pressure:    Shortness of breath upon exertion:    Short of breath when lying flat:    Irregular heart rhythm:        Vascular    Pain in calf, thigh, or hip brought on by ambulation:    Pain in feet at night that wakes you up from your sleep:     Blood clot in your veins:    Leg swelling:         Pulmonary    Oxygen at home:    Productive cough:     Wheezing:         Neurologic    Sudden weakness in arms or legs:     Sudden numbness in arms or legs:     Sudden onset of difficulty speaking or slurred speech:    Temporary loss of vision in one eye:     Problems with dizziness:         Gastrointestinal    Blood in stool:     Vomited blood:         Genitourinary    Burning when urinating:     Blood in urine:        Psychiatric    Major depression:         Hematologic    Bleeding problems:    Problems with blood clotting too easily:        Skin    Rashes or ulcers:        Constitutional    Fever or chills:      PHYSICAL EXAM: Vitals:   07/12/19 1557  BP: (!) 171/76  Pulse: 93  Resp: 18  Temp: 97.8 F (36.6 C)  TempSrc:  Temporal   SpO2: 97%  Weight: (!) 325 lb (147.4 kg)  Height: 5\' 8"  (1.727 m)    GENERAL: The patient is a well-nourished male, in no acute distress. The vital signs are documented above. CARDIAC: There is a regular rate and rhythm.  VASCULAR:  Right transverse groin incision well healed Right femoral pulse palpable Right DP/PT brisk by doppler  DATA:   None  Assessment/Plan:  68 year old male doing well after right common femoral endarterectomy and bovine patch and retrograde right common iliac stent on 06/20/2019.  His right groin has healed nicely.  He has brisk dorsalis pedis and posterior tibial signals at the right ankle.  Very pleased with his progress and he states his right leg 70% better.  As it relates to his left leg previously discussed he would need a femoral endarterectomy on the left as well.  He wants to delay this at this time since he feels his back is a more pressing issue now.  I will plan to see him back in 6 months with right iliac duplex as well as ABIs and we can make a decision about when intervene on his left common femoral artery.   Cephus Shelling, MD [facility] Office: [phone] Pager: [phone]
